# Patient Record
Sex: Female | Born: 1960 | Race: White | Hispanic: No | Marital: Single | Smoking: Never smoker
Health system: Southern US, Community
[De-identification: ages and names within clinical notes are randomized; demographics above are authoritative.]

## PROBLEM LIST (undated history)

## (undated) DIAGNOSIS — Z8669 Personal history of other diseases of the nervous system and sense organs: Secondary | ICD-10-CM

## (undated) DIAGNOSIS — B977 Papillomavirus as the cause of diseases classified elsewhere: Secondary | ICD-10-CM

## (undated) DIAGNOSIS — Z8709 Personal history of other diseases of the respiratory system: Secondary | ICD-10-CM

## (undated) DIAGNOSIS — Z87898 Personal history of other specified conditions: Secondary | ICD-10-CM

## (undated) DIAGNOSIS — F32A Depression, unspecified: Secondary | ICD-10-CM

## (undated) DIAGNOSIS — N83209 Unspecified ovarian cyst, unspecified side: Secondary | ICD-10-CM

## (undated) DIAGNOSIS — M25519 Pain in unspecified shoulder: Secondary | ICD-10-CM

## (undated) DIAGNOSIS — K635 Polyp of colon: Secondary | ICD-10-CM

## (undated) DIAGNOSIS — F329 Major depressive disorder, single episode, unspecified: Secondary | ICD-10-CM

## (undated) DIAGNOSIS — Z8619 Personal history of other infectious and parasitic diseases: Secondary | ICD-10-CM

## (undated) DIAGNOSIS — Z8742 Personal history of other diseases of the female genital tract: Secondary | ICD-10-CM

## (undated) DIAGNOSIS — Z8639 Personal history of other endocrine, nutritional and metabolic disease: Secondary | ICD-10-CM

## (undated) DIAGNOSIS — N924 Excessive bleeding in the premenopausal period: Secondary | ICD-10-CM

## (undated) DIAGNOSIS — N926 Irregular menstruation, unspecified: Secondary | ICD-10-CM

## (undated) DIAGNOSIS — E039 Hypothyroidism, unspecified: Secondary | ICD-10-CM

## (undated) HISTORY — PX: HEMORRHOID SURGERY: SHX153

## (undated) HISTORY — DX: Hypothyroidism, unspecified: E03.9

## (undated) HISTORY — DX: Depression, unspecified: F32.A

## (undated) HISTORY — DX: Personal history of other specified conditions: Z87.898

## (undated) HISTORY — DX: Papillomavirus as the cause of diseases classified elsewhere: B97.7

## (undated) HISTORY — DX: Major depressive disorder, single episode, unspecified: F32.9

## (undated) HISTORY — DX: Personal history of other diseases of the respiratory system: Z87.09

## (undated) HISTORY — DX: Polyp of colon: K63.5

## (undated) HISTORY — DX: Unspecified ovarian cyst, unspecified side: N83.209

## (undated) HISTORY — DX: Pain in unspecified shoulder: M25.519

## (undated) HISTORY — DX: Excessive bleeding in the premenopausal period: N92.4

## (undated) HISTORY — DX: Personal history of other diseases of the female genital tract: Z87.42

## (undated) HISTORY — DX: Irregular menstruation, unspecified: N92.6

## (undated) HISTORY — DX: Personal history of other endocrine, nutritional and metabolic disease: Z86.39

## (undated) HISTORY — DX: Personal history of other infectious and parasitic diseases: Z86.19

## (undated) HISTORY — DX: Personal history of other diseases of the nervous system and sense organs: Z86.69

---

## 2000-08-05 ENCOUNTER — Encounter: Payer: Self-pay | Admitting: *Deleted

## 2000-08-05 ENCOUNTER — Ambulatory Visit (HOSPITAL_COMMUNITY): Admission: RE | Admit: 2000-08-05 | Discharge: 2000-08-05 | Payer: Self-pay | Admitting: *Deleted

## 2001-12-22 ENCOUNTER — Other Ambulatory Visit: Admission: RE | Admit: 2001-12-22 | Discharge: 2001-12-22 | Payer: Self-pay | Admitting: *Deleted

## 2003-01-19 ENCOUNTER — Encounter: Payer: Self-pay | Admitting: Emergency Medicine

## 2003-01-19 ENCOUNTER — Emergency Department (HOSPITAL_COMMUNITY): Admission: EM | Admit: 2003-01-19 | Discharge: 2003-01-20 | Payer: Self-pay | Admitting: Emergency Medicine

## 2003-09-07 ENCOUNTER — Other Ambulatory Visit: Admission: RE | Admit: 2003-09-07 | Discharge: 2003-09-07 | Payer: Self-pay | Admitting: Obstetrics and Gynecology

## 2003-09-07 DIAGNOSIS — Z87898 Personal history of other specified conditions: Secondary | ICD-10-CM

## 2003-09-07 DIAGNOSIS — N926 Irregular menstruation, unspecified: Secondary | ICD-10-CM

## 2003-09-07 DIAGNOSIS — Z872 Personal history of diseases of the skin and subcutaneous tissue: Secondary | ICD-10-CM

## 2003-09-07 HISTORY — DX: Personal history of other specified conditions: Z87.898

## 2003-09-07 HISTORY — DX: Irregular menstruation, unspecified: N92.6

## 2003-09-07 HISTORY — DX: Personal history of diseases of the skin and subcutaneous tissue: Z87.2

## 2003-09-15 ENCOUNTER — Ambulatory Visit (HOSPITAL_COMMUNITY): Admission: RE | Admit: 2003-09-15 | Discharge: 2003-09-15 | Payer: Self-pay | Admitting: Obstetrics and Gynecology

## 2004-08-12 ENCOUNTER — Other Ambulatory Visit: Admission: RE | Admit: 2004-08-12 | Discharge: 2004-08-12 | Payer: Self-pay | Admitting: Family Medicine

## 2004-12-31 ENCOUNTER — Other Ambulatory Visit: Admission: RE | Admit: 2004-12-31 | Discharge: 2004-12-31 | Payer: Self-pay | Admitting: Obstetrics and Gynecology

## 2005-01-02 ENCOUNTER — Ambulatory Visit (HOSPITAL_COMMUNITY): Admission: RE | Admit: 2005-01-02 | Discharge: 2005-01-02 | Payer: Self-pay | Admitting: Obstetrics and Gynecology

## 2006-01-20 ENCOUNTER — Ambulatory Visit (HOSPITAL_COMMUNITY): Admission: RE | Admit: 2006-01-20 | Discharge: 2006-01-20 | Payer: Self-pay | Admitting: Obstetrics and Gynecology

## 2006-01-20 ENCOUNTER — Other Ambulatory Visit: Admission: RE | Admit: 2006-01-20 | Discharge: 2006-01-20 | Payer: Self-pay | Admitting: Obstetrics and Gynecology

## 2007-01-18 DIAGNOSIS — Z87898 Personal history of other specified conditions: Secondary | ICD-10-CM

## 2007-01-18 DIAGNOSIS — Z8742 Personal history of other diseases of the female genital tract: Secondary | ICD-10-CM

## 2007-01-18 HISTORY — DX: Personal history of other diseases of the female genital tract: Z87.42

## 2007-01-18 HISTORY — DX: Personal history of other specified conditions: Z87.898

## 2007-01-22 ENCOUNTER — Ambulatory Visit (HOSPITAL_COMMUNITY): Admission: RE | Admit: 2007-01-22 | Discharge: 2007-01-22 | Payer: Self-pay | Admitting: Obstetrics and Gynecology

## 2007-02-08 DIAGNOSIS — Z87898 Personal history of other specified conditions: Secondary | ICD-10-CM

## 2007-02-08 HISTORY — DX: Personal history of other specified conditions: Z87.898

## 2009-01-10 ENCOUNTER — Ambulatory Visit (HOSPITAL_COMMUNITY): Admission: RE | Admit: 2009-01-10 | Discharge: 2009-01-10 | Payer: Self-pay | Admitting: Obstetrics and Gynecology

## 2009-10-10 ENCOUNTER — Encounter: Admission: RE | Admit: 2009-10-10 | Discharge: 2009-10-10 | Payer: Self-pay | Admitting: Endocrinology

## 2010-01-11 ENCOUNTER — Ambulatory Visit (HOSPITAL_COMMUNITY): Admission: RE | Admit: 2010-01-11 | Discharge: 2010-01-11 | Payer: Self-pay | Admitting: Obstetrics and Gynecology

## 2010-01-28 DIAGNOSIS — N924 Excessive bleeding in the premenopausal period: Secondary | ICD-10-CM

## 2010-01-28 HISTORY — DX: Excessive bleeding in the premenopausal period: N92.4

## 2010-04-30 DIAGNOSIS — N83209 Unspecified ovarian cyst, unspecified side: Secondary | ICD-10-CM

## 2010-04-30 HISTORY — DX: Unspecified ovarian cyst, unspecified side: N83.209

## 2010-10-11 ENCOUNTER — Encounter
Admission: RE | Admit: 2010-10-11 | Discharge: 2010-10-11 | Payer: Self-pay | Source: Home / Self Care | Attending: Endocrinology | Admitting: Endocrinology

## 2010-11-08 ENCOUNTER — Other Ambulatory Visit: Payer: Self-pay | Admitting: Endocrinology

## 2010-11-08 ENCOUNTER — Other Ambulatory Visit (HOSPITAL_COMMUNITY)
Admission: RE | Admit: 2010-11-08 | Discharge: 2010-11-08 | Disposition: A | Discharge status: Home / Self Care | Payer: Commercial Indemnity | Source: Ambulatory Visit | Attending: Endocrinology | Admitting: Endocrinology

## 2010-11-08 DIAGNOSIS — E049 Nontoxic goiter, unspecified: Secondary | ICD-10-CM | POA: Insufficient documentation

## 2010-11-12 ENCOUNTER — Other Ambulatory Visit: Payer: Self-pay | Admitting: Endocrinology

## 2010-11-12 DIAGNOSIS — E041 Nontoxic single thyroid nodule: Secondary | ICD-10-CM

## 2010-11-20 ENCOUNTER — Other Ambulatory Visit: Payer: Self-pay | Admitting: Endocrinology

## 2010-11-20 DIAGNOSIS — E041 Nontoxic single thyroid nodule: Secondary | ICD-10-CM

## 2010-11-26 ENCOUNTER — Other Ambulatory Visit: Payer: Self-pay

## 2011-01-09 ENCOUNTER — Other Ambulatory Visit (HOSPITAL_COMMUNITY): Payer: Self-pay | Admitting: Obstetrics and Gynecology

## 2011-01-09 DIAGNOSIS — Z1231 Encounter for screening mammogram for malignant neoplasm of breast: Secondary | ICD-10-CM

## 2011-01-24 ENCOUNTER — Ambulatory Visit (HOSPITAL_COMMUNITY)
Admission: RE | Admit: 2011-01-24 | Discharge: 2011-01-24 | Disposition: A | Discharge status: Home / Self Care | Payer: Commercial Indemnity | Source: Ambulatory Visit | Attending: Obstetrics and Gynecology | Admitting: Obstetrics and Gynecology

## 2011-01-24 DIAGNOSIS — Z1231 Encounter for screening mammogram for malignant neoplasm of breast: Secondary | ICD-10-CM | POA: Insufficient documentation

## 2011-04-14 ENCOUNTER — Other Ambulatory Visit: Payer: Commercial Indemnity

## 2011-04-23 ENCOUNTER — Ambulatory Visit
Admission: RE | Admit: 2011-04-23 | Discharge: 2011-04-23 | Disposition: A | Discharge status: Home / Self Care | Payer: Commercial Indemnity | Source: Ambulatory Visit | Attending: Endocrinology | Admitting: Endocrinology

## 2011-04-23 DIAGNOSIS — E041 Nontoxic single thyroid nodule: Secondary | ICD-10-CM

## 2012-02-24 ENCOUNTER — Encounter: Payer: Self-pay | Admitting: Obstetrics and Gynecology

## 2012-02-24 ENCOUNTER — Ambulatory Visit (INDEPENDENT_AMBULATORY_CARE_PROVIDER_SITE_OTHER): Payer: BC Managed Care – PPO | Admitting: Obstetrics and Gynecology

## 2012-02-24 VITALS — BP 108/68 | Ht 65.25 in | Wt 152.0 lb

## 2012-02-24 DIAGNOSIS — Z124 Encounter for screening for malignant neoplasm of cervix: Secondary | ICD-10-CM

## 2012-02-24 DIAGNOSIS — Z862 Personal history of diseases of the blood and blood-forming organs and certain disorders involving the immune mechanism: Secondary | ICD-10-CM

## 2012-02-24 DIAGNOSIS — E039 Hypothyroidism, unspecified: Secondary | ICD-10-CM

## 2012-02-24 DIAGNOSIS — Z8639 Personal history of other endocrine, nutritional and metabolic disease: Secondary | ICD-10-CM

## 2012-02-24 DIAGNOSIS — D259 Leiomyoma of uterus, unspecified: Secondary | ICD-10-CM

## 2012-02-24 DIAGNOSIS — D219 Benign neoplasm of connective and other soft tissue, unspecified: Secondary | ICD-10-CM

## 2012-02-24 MED ORDER — CLOTRIMAZOLE-BETAMETHASONE 1-0.05 % EX LOTN: TOPICAL_LOTION | Freq: Two times a day (BID) | CUTANEOUS | Status: AC

## 2012-02-24 NOTE — Progress Notes (Signed)
Last Pap:01/24/2009 WNL: Yes Regular Periods:no Contraception: none  Monthly Breast exam:yes occ Tetanus<75yrs:no ? Nl.Bladder Function:yes Daily BMs:yes Healthy Diet:yes Calcium:yes Mammogram:yes 01/24/2011 Exercise:yes Seatbelt: yes Abuse at home: no Stressful work:no Sigmoid-colonoscopy: 04/19/2010 Dr.Mann Bone Density: Yes 2012-wnl per pt Dr.Kohut BMI=25  Entered by cw,cma Subjective:   Subjective:    Marie Avery is a 51 y.o. female G0P0000 who presents for annual exam.  She has a several month history of rash under breasts which itches.  Has had it before, treated with a shampoo  The following portions of the patient's history were reviewed and updated as appropriate: allergies, current medications, past family history, past medical history, past social history, past surgical history and problem list.  Review of Systems Pertinent items are noted in HPI. Gastrointestinal:No change in bowel habits, no abdominal pain, no rectal bleeding Genitourinary:negative for dysuria, frequency, hematuria, nocturia and urinary incontinence    Objective:     BP 108/68  Ht 5' 5.25" (1.657 m)  Wt 152 lb (68.947 kg)  BMI 25.10 kg/m2  Weight:  Wt Readings from Last 1 Encounters:  02/24/12 152 lb (68.947 kg)     BMI: Body mass index is 25.10 kg/(m^2). General Appearance: Alert, appropriate appearance for age. No acute distress HEENT: Grossly normal Neck / Thyroid: Supple, no masses, nodes or enlargement Lungs: clear to auscultation bilaterally Back: No CVA tenderness Breast Exam: No masses or nodes.No dimpling, nipple retraction or discharge.Submammry eruption with circular scaling lesions Cardiovascular: Regular rate and rhythm. S1, S2, no murmur Gastrointestinal: Soft, non-tender, no masses or organomegaly Pelvic Exam: Vulva and vagina appear normal. Bimanual exam reveals normal uterus and adnexa. Rectovaginal: normal rectal, no masses Lymphatic Exam: Non-palpable nodes in  neck, clavicular, axillary, or inguinal regions Skin: no rash or abnormalities Neurologic: Normal gait and speech, no tremor  Psychiatric: Alert and oriented, appropriate affect.    Urinalysis:Not done      Assessment:    Asymptomatic fibroids  Tinea versicolor   Plan:    Pap with HR HPV Lotrisone lotion MG  Follow-up:  for annual exam       Marie Avery PMD

## 2012-02-24 NOTE — Patient Instructions (Signed)
Tinea Versicolor Tinea versicolor is a common yeast infection of the skin. This condition becomes known when the yeast on our skin starts to overgrow (yeast is a normal inhabitant on our skin). This condition is noticed as white or light brown patches on brown skin, and is more evident in the summer on tanned skin. These areas are slightly scaly if scratched. The light patches from the yeast become evident when the yeast creates "holes in your suntan". This is most often noticed in the summer. The patches are usually located on the chest, back, pubis, neck and body folds. However, it may occur on any area of body. Mild itching and inflammation (redness or soreness) may be present. DIAGNOSIS   The diagnosisof this is made clinically (by looking). Cultures from samples are usually not needed. Examination under the microscope may help. However, yeast is normally found on skin. The diagnosis still remains clinical. Examination under Wood's Ultraviolet Light can determine the extent of the infection. TREATMENT   This common infection is usually only of cosmetic (only a concern to your appearance). It is easily treated with dandruff shampoo used during showers or bathing. Vigorous scrubbing will eliminate the yeast over several days time. The light areas in your skin may remain for weeks or months after the infection is cured unless your skin is exposed to sunlight. The lighter or darker spots caused by the fungus that remain after complete treatment are not a sign of treatment failure; it will take a long time to resolve. Your caregiver may recommend a number of commercial preparations or medication by mouth if home care is not working. Recurrence is common and preventative medication may be necessary. This skin condition is not highly contagious. Special care is not needed to protect close friends and family members. Normal hygiene is usually enough. Follow up is required only if you develop complications (such as  a secondary infection from scratching), if recommended by your caregiver, or if no relief is obtained from the preparations used. Document Released: 09/19/2000 Document Revised: 09/11/2011 Document Reviewed: 11/01/2008 Exit[employer] Patient Information 2012 [employer]. 

## 2012-02-27 LAB — PAP IG AND HPV HIGH-RISK: HPV DNA High Risk: NOT DETECTED

## 2014-02-11 ENCOUNTER — Encounter (HOSPITAL_COMMUNITY): Payer: Self-pay | Admitting: Emergency Medicine

## 2014-02-11 ENCOUNTER — Emergency Department (HOSPITAL_COMMUNITY)
Admission: EM | Admit: 2014-02-11 | Discharge: 2014-02-11 | Disposition: A | Discharge status: Home / Self Care | Payer: BC Managed Care – PPO | Attending: Emergency Medicine | Admitting: Emergency Medicine

## 2014-02-11 ENCOUNTER — Emergency Department (HOSPITAL_COMMUNITY): Payer: BC Managed Care – PPO

## 2014-02-11 DIAGNOSIS — T23129A Burn of first degree of unspecified single finger (nail) except thumb, initial encounter: Secondary | ICD-10-CM | POA: Insufficient documentation

## 2014-02-11 DIAGNOSIS — Z8619 Personal history of other infectious and parasitic diseases: Secondary | ICD-10-CM | POA: Insufficient documentation

## 2014-02-11 DIAGNOSIS — Y92410 Unspecified street and highway as the place of occurrence of the external cause: Secondary | ICD-10-CM | POA: Insufficient documentation

## 2014-02-11 DIAGNOSIS — T23109A Burn of first degree of unspecified hand, unspecified site, initial encounter: Secondary | ICD-10-CM | POA: Diagnosis present

## 2014-02-11 DIAGNOSIS — Z23 Encounter for immunization: Secondary | ICD-10-CM | POA: Insufficient documentation

## 2014-02-11 DIAGNOSIS — Z79899 Other long term (current) drug therapy: Secondary | ICD-10-CM | POA: Insufficient documentation

## 2014-02-11 DIAGNOSIS — Z860100 Personal history of colon polyps, unspecified: Secondary | ICD-10-CM | POA: Insufficient documentation

## 2014-02-11 DIAGNOSIS — E039 Hypothyroidism, unspecified: Secondary | ICD-10-CM | POA: Insufficient documentation

## 2014-02-11 DIAGNOSIS — Z8709 Personal history of other diseases of the respiratory system: Secondary | ICD-10-CM | POA: Insufficient documentation

## 2014-02-11 DIAGNOSIS — Z8679 Personal history of other diseases of the circulatory system: Secondary | ICD-10-CM | POA: Insufficient documentation

## 2014-02-11 DIAGNOSIS — Z8639 Personal history of other endocrine, nutritional and metabolic disease: Secondary | ICD-10-CM | POA: Insufficient documentation

## 2014-02-11 DIAGNOSIS — Z791 Long term (current) use of non-steroidal anti-inflammatories (NSAID): Secondary | ICD-10-CM | POA: Insufficient documentation

## 2014-02-11 DIAGNOSIS — T148XXA Other injury of unspecified body region, initial encounter: Secondary | ICD-10-CM | POA: Diagnosis present

## 2014-02-11 DIAGNOSIS — R0602 Shortness of breath: Secondary | ICD-10-CM | POA: Insufficient documentation

## 2014-02-11 DIAGNOSIS — S3981XA Other specified injuries of abdomen, initial encounter: Secondary | ICD-10-CM | POA: Insufficient documentation

## 2014-02-11 DIAGNOSIS — IMO0002 Reserved for concepts with insufficient information to code with codable children: Secondary | ICD-10-CM | POA: Insufficient documentation

## 2014-02-11 DIAGNOSIS — Z8659 Personal history of other mental and behavioral disorders: Secondary | ICD-10-CM | POA: Insufficient documentation

## 2014-02-11 DIAGNOSIS — Z8742 Personal history of other diseases of the female genital tract: Secondary | ICD-10-CM | POA: Insufficient documentation

## 2014-02-11 DIAGNOSIS — Y9389 Activity, other specified: Secondary | ICD-10-CM | POA: Insufficient documentation

## 2014-02-11 DIAGNOSIS — Z880 Allergy status to penicillin: Secondary | ICD-10-CM | POA: Insufficient documentation

## 2014-02-11 MED ORDER — ACETAMINOPHEN 325 MG PO TABS
650.0000 mg | ORAL_TABLET | Freq: Once | ORAL | Status: AC
  Administered 2014-02-11: 650 mg via ORAL
  Filled 2014-02-11: qty 2

## 2014-02-11 MED ORDER — TETANUS-DIPHTH-ACELL PERTUSSIS 5-2.5-18.5 LF-MCG/0.5 IM SUSP
0.5000 mL | Freq: Once | INTRAMUSCULAR | Status: AC
  Administered 2014-02-11: 0.5 mL via INTRAMUSCULAR
  Filled 2014-02-11: qty 0.5
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
## 2014-02-11 NOTE — Discharge Instructions (Signed)

## 2014-02-11 NOTE — ED Notes (Signed)
MD Harrison at bedside. 

## 2014-08-16 ENCOUNTER — Other Ambulatory Visit (HOSPITAL_COMMUNITY): Payer: Self-pay | Admitting: Obstetrics and Gynecology

## 2014-08-16 DIAGNOSIS — Z1231 Encounter for screening mammogram for malignant neoplasm of breast: Secondary | ICD-10-CM

## 2014-09-07 ENCOUNTER — Ambulatory Visit (HOSPITAL_COMMUNITY)
Admission: RE | Admit: 2014-09-07 | Discharge: 2014-09-07 | Disposition: A | Discharge status: Home / Self Care | Payer: BC Managed Care – PPO | Source: Ambulatory Visit | Attending: Obstetrics and Gynecology | Admitting: Obstetrics and Gynecology

## 2014-09-07 DIAGNOSIS — Z1231 Encounter for screening mammogram for malignant neoplasm of breast: Secondary | ICD-10-CM

## 2014-12-25 ENCOUNTER — Other Ambulatory Visit: Payer: Self-pay | Admitting: Endocrinology

## 2014-12-25 DIAGNOSIS — E041 Nontoxic single thyroid nodule: Secondary | ICD-10-CM

## 2015-01-01 ENCOUNTER — Ambulatory Visit
Admission: RE | Admit: 2015-01-01 | Discharge: 2015-01-01 | Disposition: A | Discharge status: Home / Self Care | Payer: BLUE CROSS/BLUE SHIELD | Source: Ambulatory Visit | Attending: Endocrinology | Admitting: Endocrinology

## 2015-01-01 DIAGNOSIS — E041 Nontoxic single thyroid nodule: Secondary | ICD-10-CM

## 2016-07-31 ENCOUNTER — Other Ambulatory Visit: Payer: Self-pay | Admitting: Endocrinology

## 2016-07-31 DIAGNOSIS — E041 Nontoxic single thyroid nodule: Secondary | ICD-10-CM

## 2016-08-05 ENCOUNTER — Ambulatory Visit
Admission: RE | Admit: 2016-08-05 | Discharge: 2016-08-05 | Disposition: A | Discharge status: Home / Self Care | Payer: BLUE CROSS/BLUE SHIELD | Source: Ambulatory Visit | Attending: Endocrinology | Admitting: Endocrinology

## 2016-08-05 DIAGNOSIS — E041 Nontoxic single thyroid nodule: Secondary | ICD-10-CM

## 2017-03-20 IMAGING — US US SOFT TISSUE HEAD/NECK
1 series · 13 of 25 positions shown · non-contrast
Comparison: 04/23/2011, 10/11/2010, 10/10/2009, 10/08/2007

CLINICAL DATA: 53-year-old female with a history of thyroid
nodules.

EXAM:
THYROID ULTRASOUND
TECHNIQUE: Ultrasound examination of the thyroid gland and adjacent soft
tissues was performed.

[Series 1: us soft tissue head/neck · 0.08mm/px · 13 of 72 slices shown]
[im 1/72]
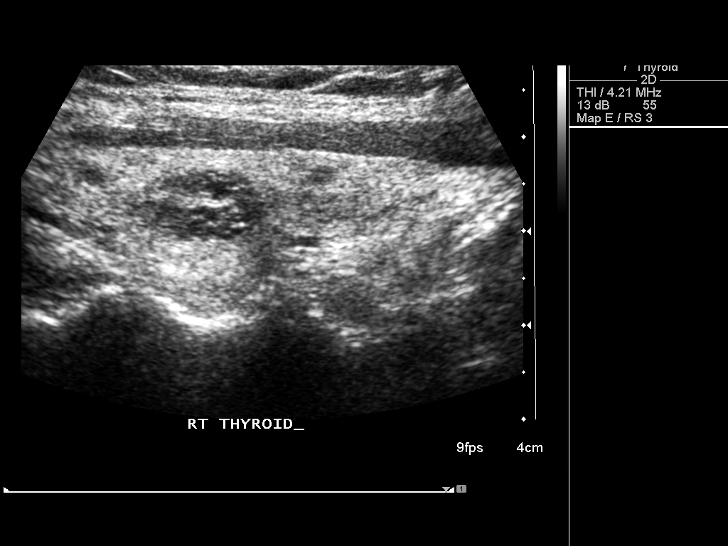
[im 6/72]
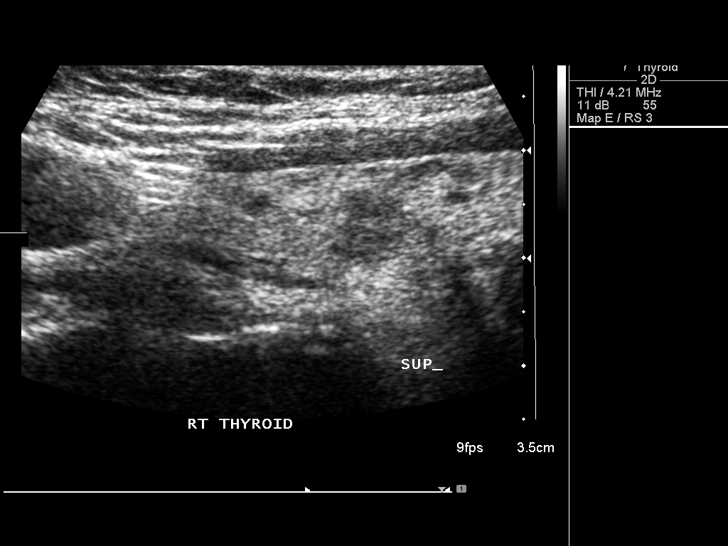
[im 12/72]
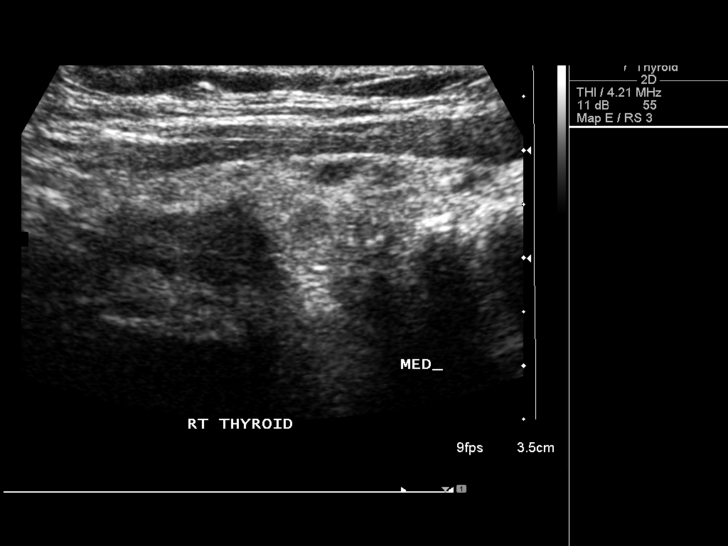
[im 18/72]
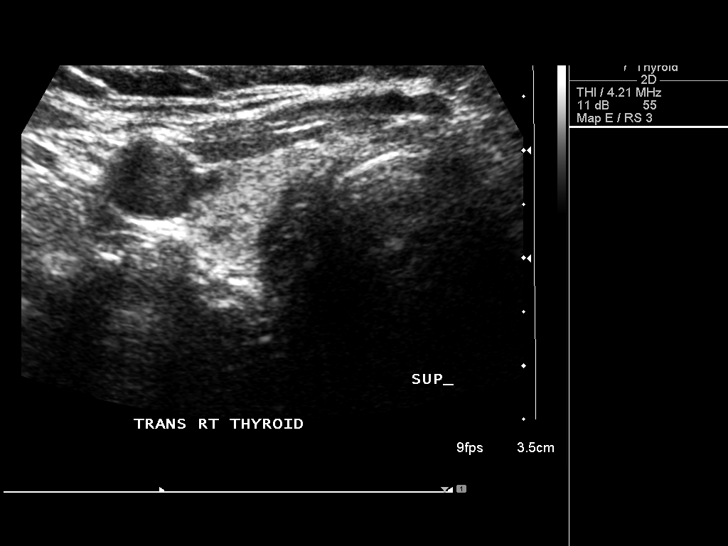
[im 24/72]
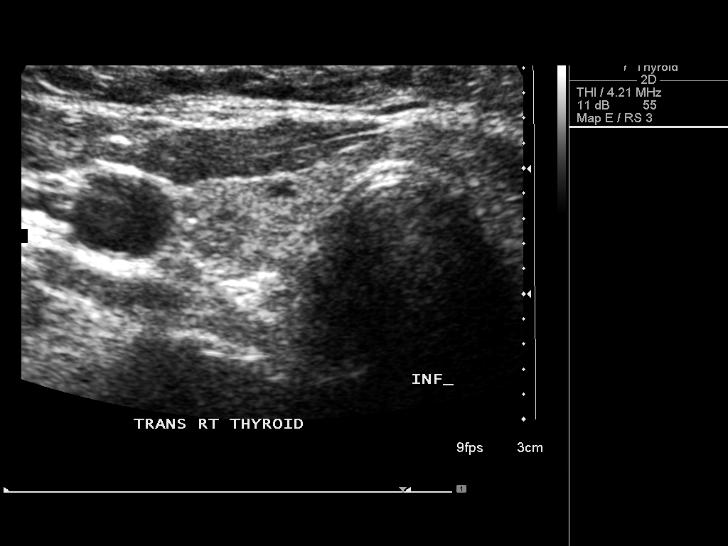
[im 30/72]
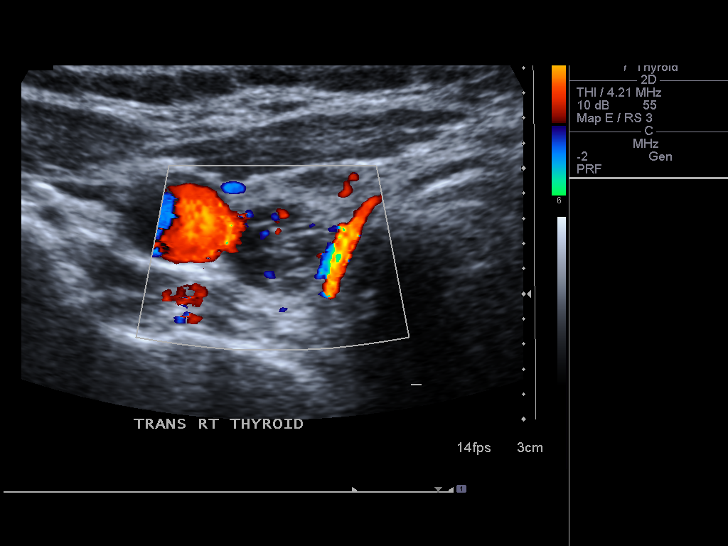
[im 36/72]
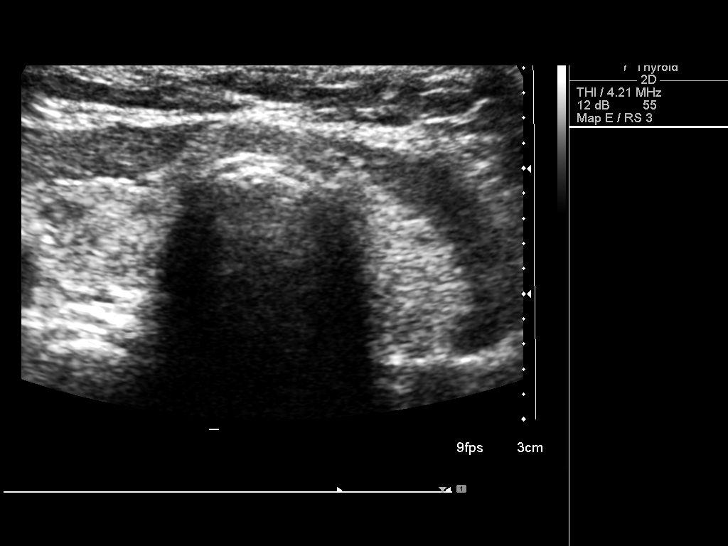
[im 42/72]
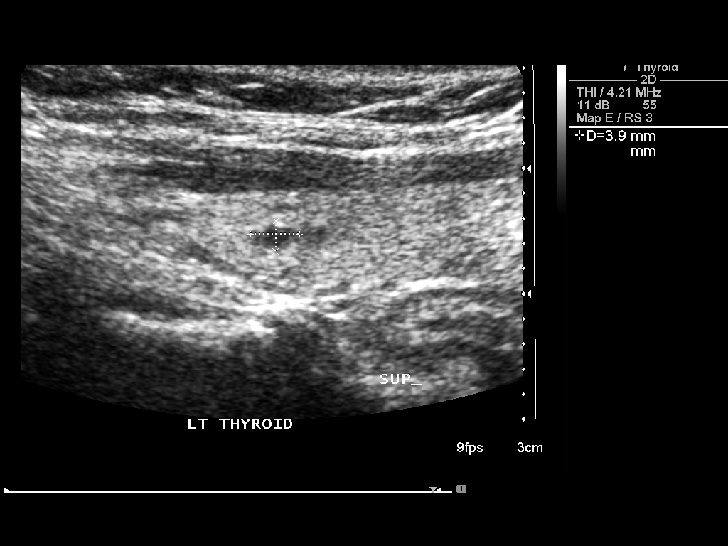
[im 48/72]
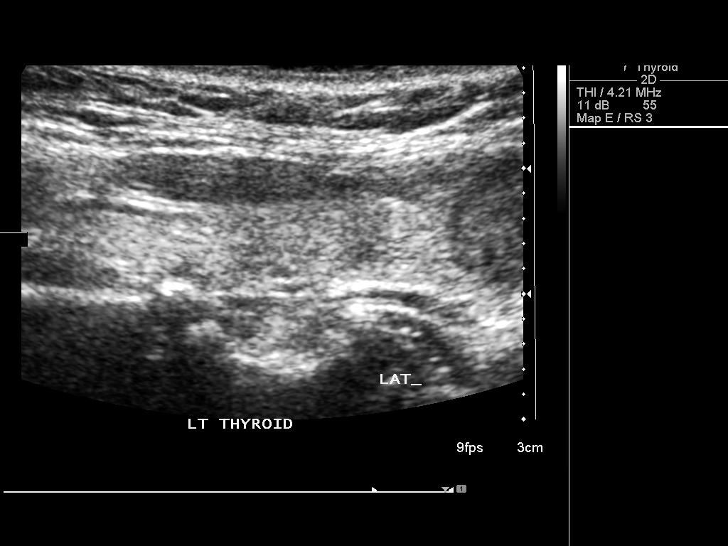
[im 54/72]
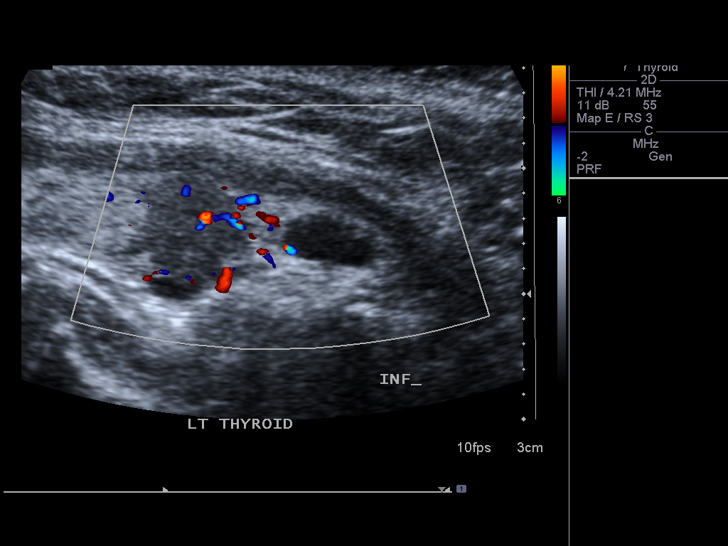
[im 60/72]
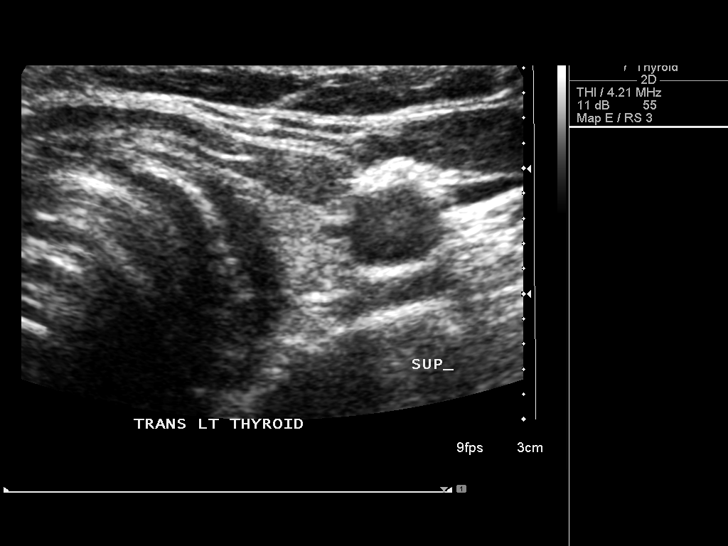
[im 66/72]
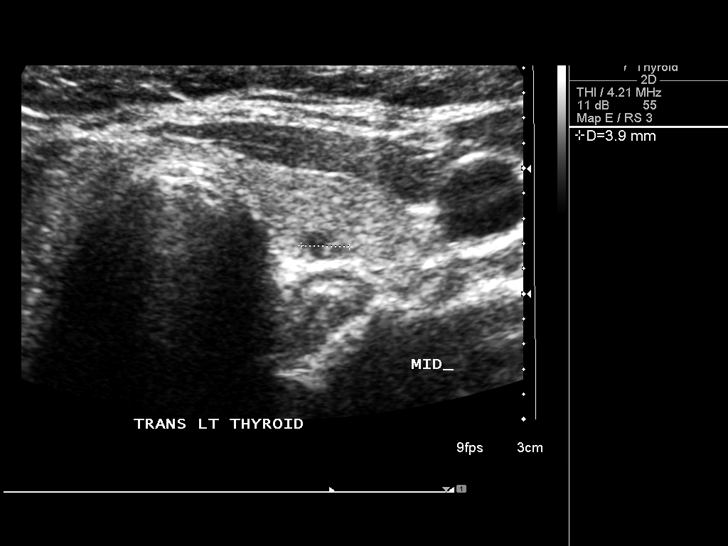
[im 72/72]
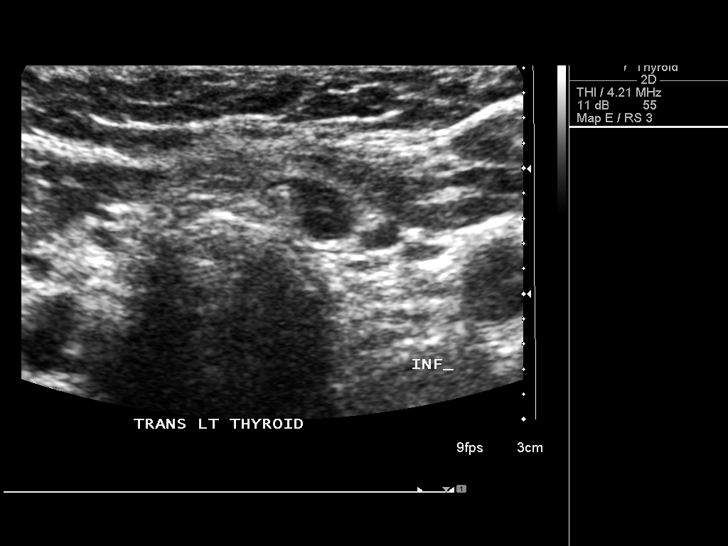

[13 of 25 positions shown; findings below may reference images not displayed]

FINDINGS: Right thyroid lobe

Measurements: 4.4 cm x 1.5 cm x 1.2 cm. Relatively heterogeneous
appearance of right thyroid tissue. Multiple right-sided thyroid
nodules are again identified. The majority are less than 1 cm.
Largest measures 1.4 cm x 8 mm x 10 mm. No internal calcifications.

Left thyroid lobe

Measurements: 4.4 cm x 0.9 cm x 1.3 cm. Heterogeneous appearance of
the left thyroid tissue. Multiple left-sided nodules are again
identified, all of which are 1 cm or less. The 1 cm lesion
demonstrates no internal calcifications.

Isthmus

Thickness: 3 mm.  No nodules visualized.

Lymphadenopathy

None visualized.
IMPRESSION: Multinodular thyroid, with none of the lesions meeting criteria for
biopsy.

Follow-up by clinical exam is recommended. If patient has known risk
factors for thyroid carcinoma, consider follow-up ultrasound in 12
months. If patient is clinically hyperthyroid, consider nuclear
medicine thyroid uptake and scan.Reference: Management of Thyroid
Nodules Detected at US: Society of Radiologists in Ultrasound

## 2018-01-01 ENCOUNTER — Other Ambulatory Visit: Payer: Self-pay | Admitting: Internal Medicine

## 2018-01-01 DIAGNOSIS — E042 Nontoxic multinodular goiter: Secondary | ICD-10-CM

## 2018-01-11 ENCOUNTER — Ambulatory Visit
Admission: RE | Admit: 2018-01-11 | Discharge: 2018-01-11 | Disposition: A | Discharge status: Home / Self Care | Payer: BLUE CROSS/BLUE SHIELD | Source: Ambulatory Visit | Attending: Internal Medicine | Admitting: Internal Medicine

## 2018-01-11 DIAGNOSIS — E042 Nontoxic multinodular goiter: Secondary | ICD-10-CM

## 2018-10-23 IMAGING — US US THYROID
1 series · 13 of 25 positions shown · non-contrast
Comparison: 01/01/2015

CLINICAL DATA: Prior ultrasound follow-up.  Follow-up nodules

EXAM:
THYROID ULTRASOUND
TECHNIQUE: Ultrasound examination of the thyroid gland and adjacent soft
tissues was performed.

[Series 1: us thyroid · 0.06mm/px · 13 of 90 slices shown]
[im 1/90]
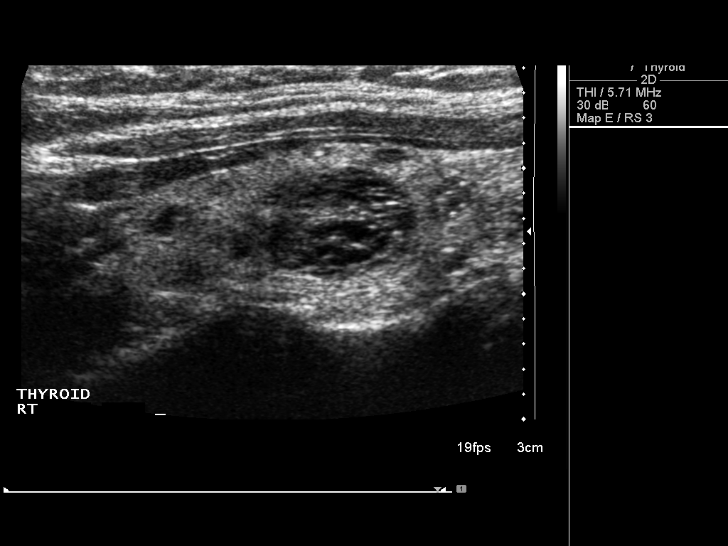
[im 8/90]
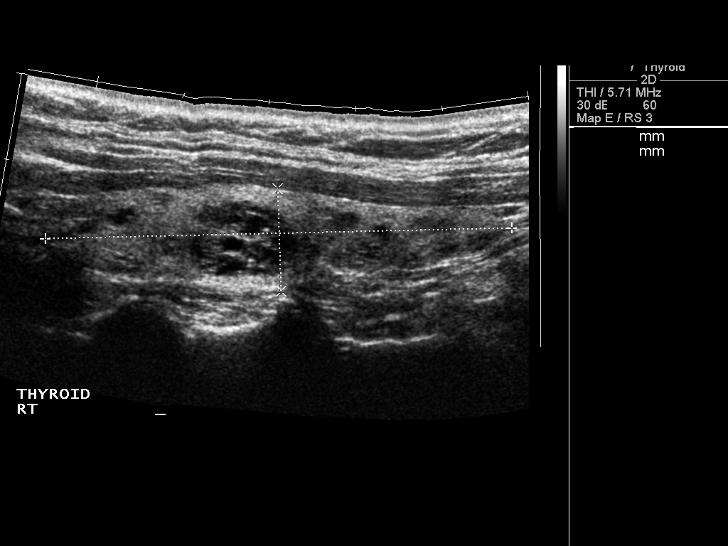
[im 15/90]
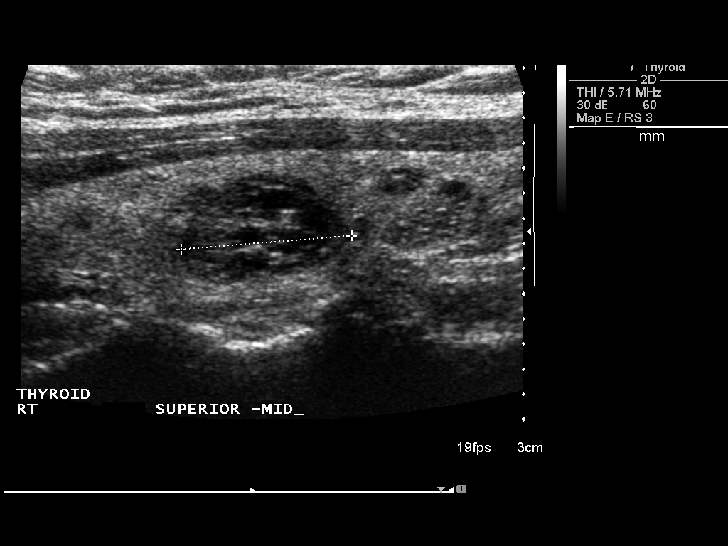
[im 23/90]
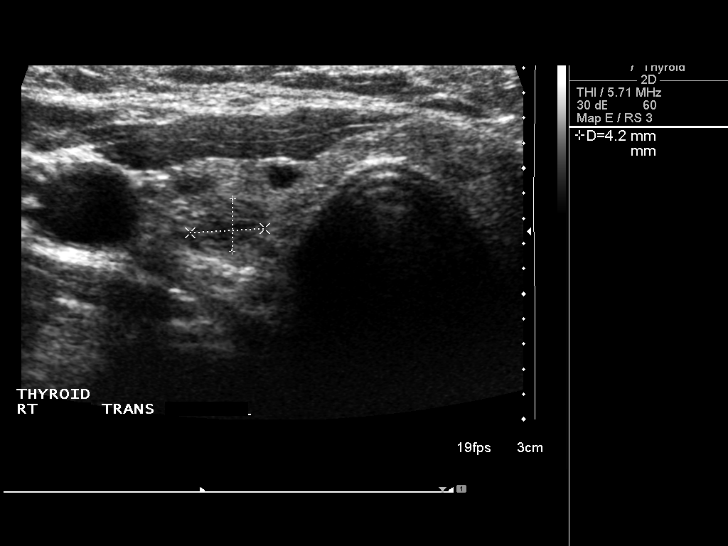
[im 30/90]
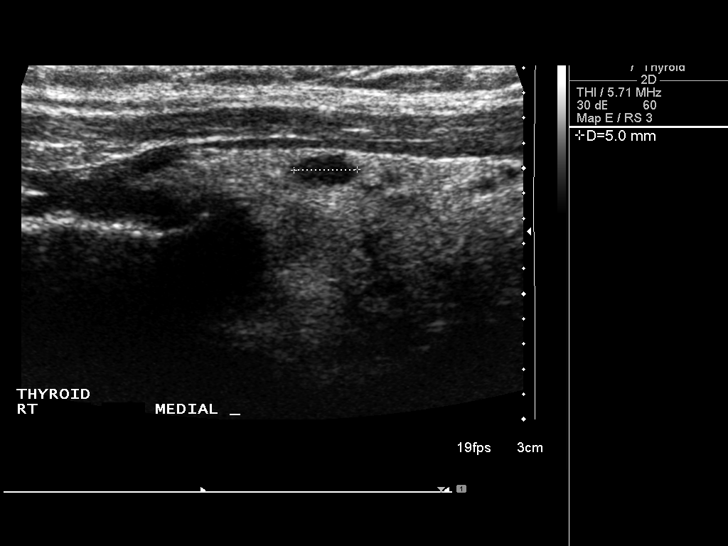
[im 38/90]
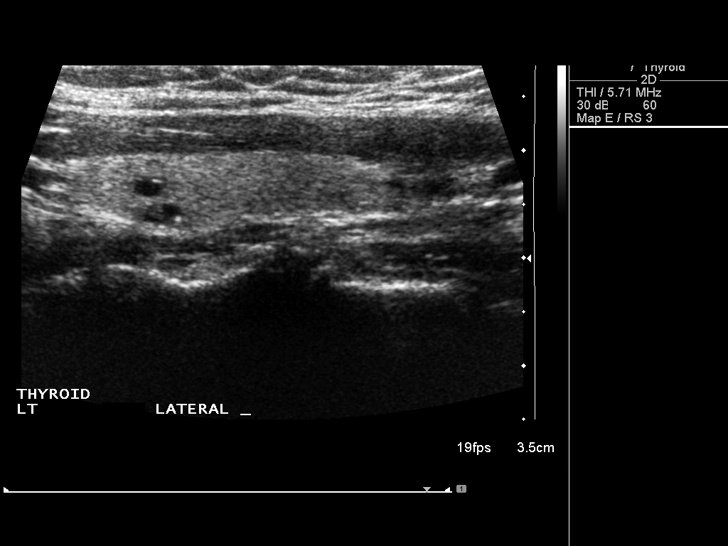
[im 45/90]
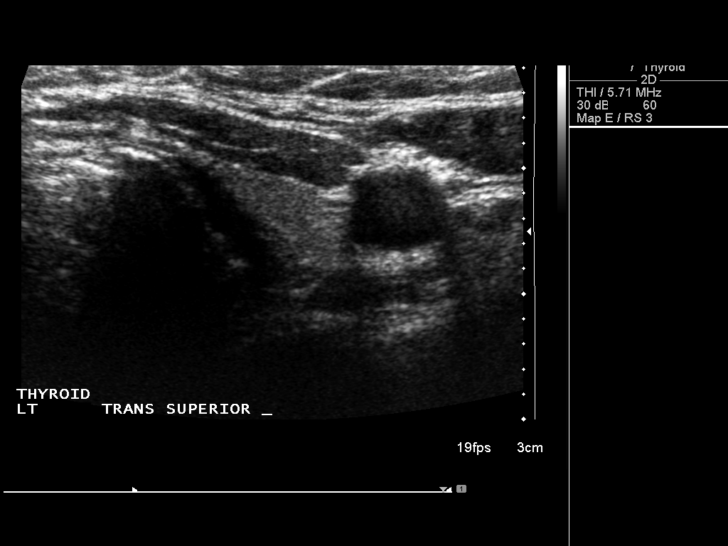
[im 52/90]
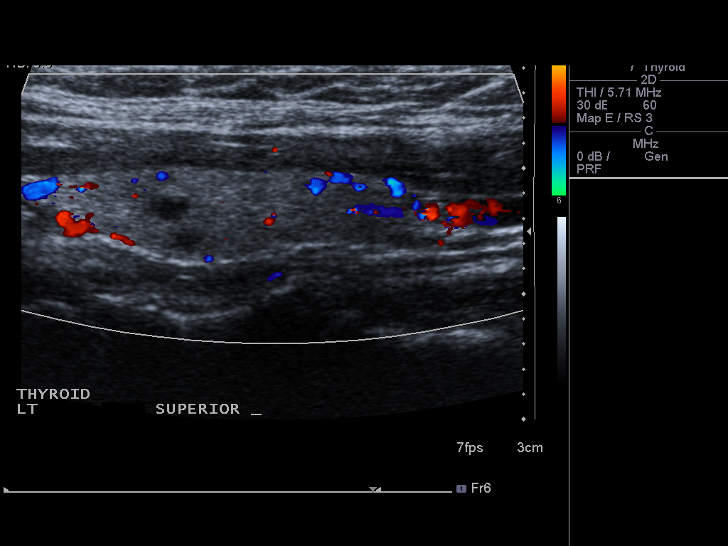
[im 60/90]
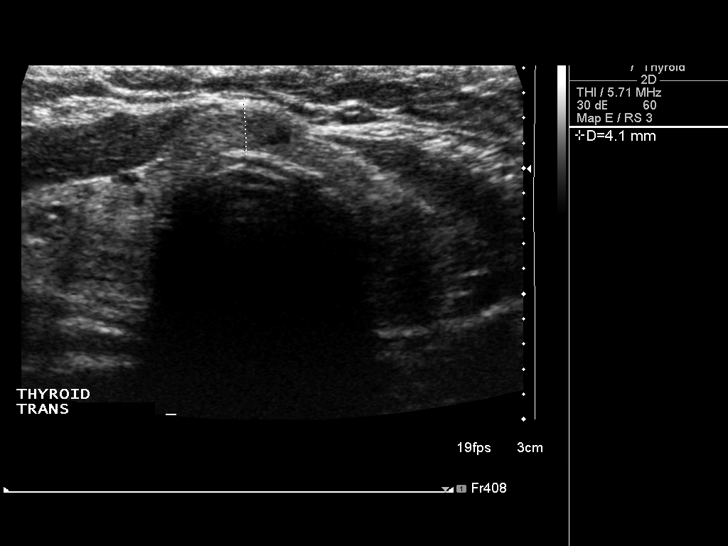
[im 67/90]
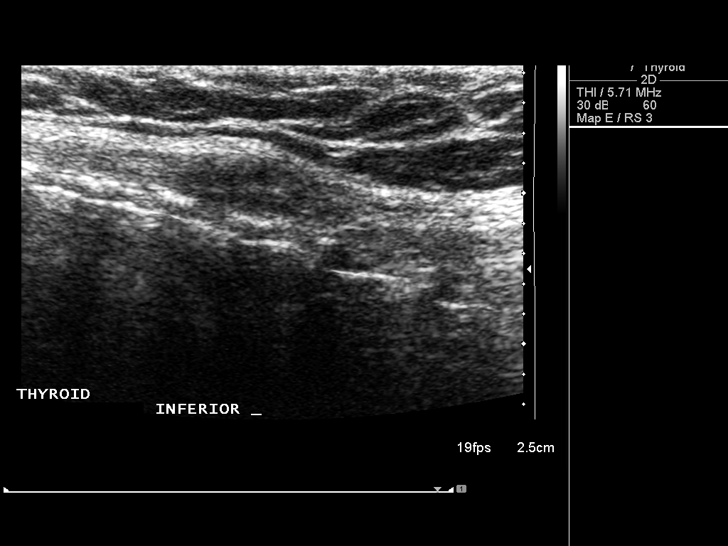
[im 75/90]
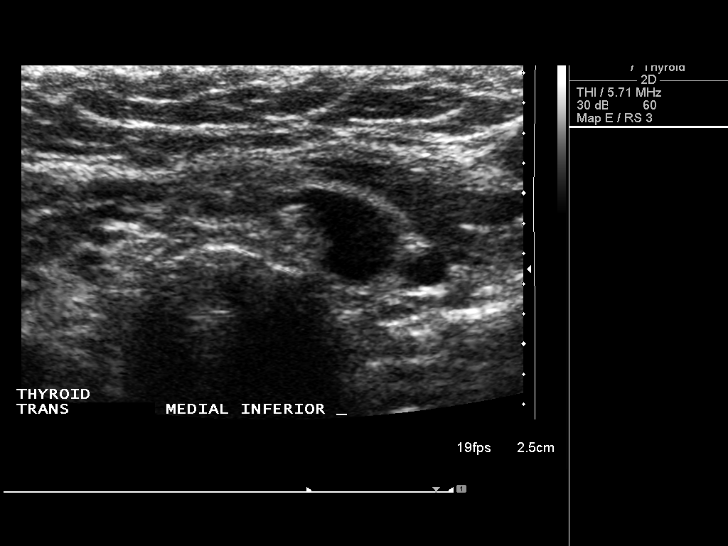
[im 82/90]
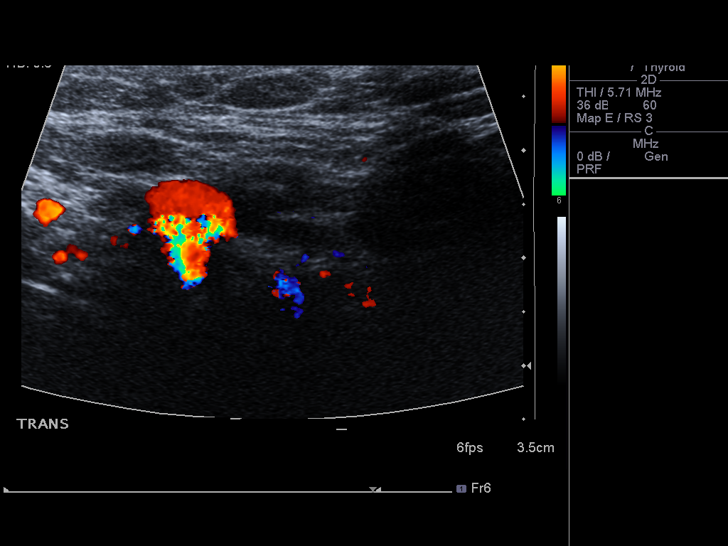
[im 90/90]
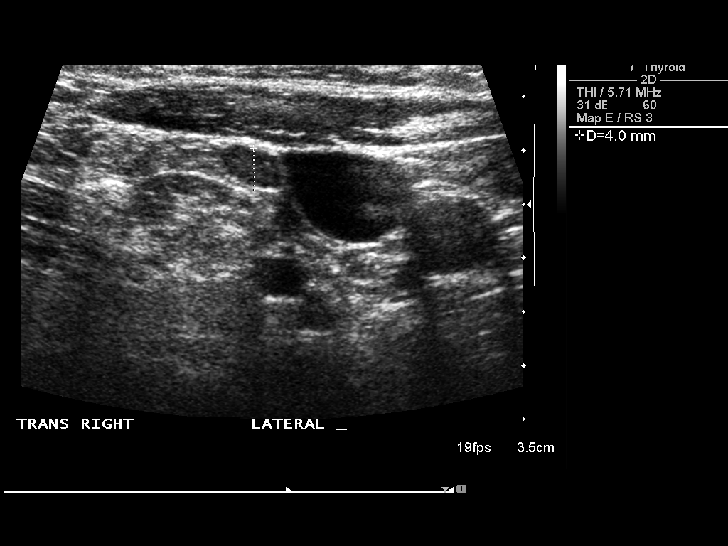

[13 of 25 positions shown; findings below may reference images not displayed]

FINDINGS: Parenchymal Echotexture: Moderately heterogenous

Estimated total number of nodules >/= 1 cm: 4

Number of spongiform nodules >/=  2 cm not described below (TR1): 0

Number of mixed cystic and solid nodules >/= 1.5 cm not described
below (TR2): 0

_________________________________________________________

Isthmus: 0.4 cm, previously 0.3 cm

Right isthmic nodule measures 1.0 x 0.3 x 1.1 cm. In retrospect, it
was probably present and is stable in size.

Left isthmic nodules measures 1.0 x 0.5 x 0.6 cm. It is new. It
measures 1.0 cm, is solid, and is hypoechoic. It is a TR4 lesion
requiring follow-up.

0.9 cm cyst has a benign appearance.

_________________________________________________________

Right lobe: 5.4 x 1.2 x 1.5 cm, previously 4.4 x 1.5 x 1.2 cm

1.4 x 0.9 x 1.0 cm upper pole nodule previously measured 1.4 x 0.8 x
1.0 cm

Other nodules are not significantly change measuring 0.5 cm or less
in size.

_________________________________________________________

Left lobe: 4.5 x 0.8 x 1.1 cm, previously 4.4 x 0.9 x 1.3 cm

1.0 x 1.0 x 0.9 cm lower pole hypoechoic nodule previously measured
1.0 x 1.0 x 0.9 cm.

Other smaller nodules are not significantly changed.
IMPRESSION: Most of the nodules throughout both lobes in the isthmus are not
significantly changed.

There is potentially a new nodule in the isthmus measuring 1.0 cm
requiring annual follow-up.

The above is in keeping with the ACR TI-RADS recommendations - [HOSPITAL] 1956;[DATE].

## 2018-10-31 IMAGING — US US THYROID
1 series · 12 of 25 positions shown · non-contrast
Comparison: 08/05/2016, 10/11/2010 and 10/08/2006

CLINICAL DATA: Multinodular goiter, nontoxic.  Follow-up nodules.

EXAM:
THYROID ULTRASOUND
TECHNIQUE: Ultrasound examination of the thyroid gland and adjacent soft
tissues was performed.

[Series 1: us thyroid · 0.04mm/px · 12 of 77 slices shown]
[im 4/77]
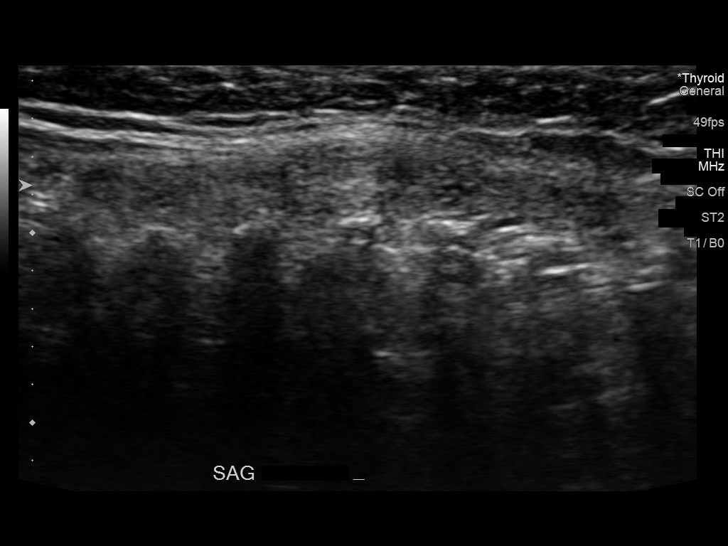
[im 10/77]
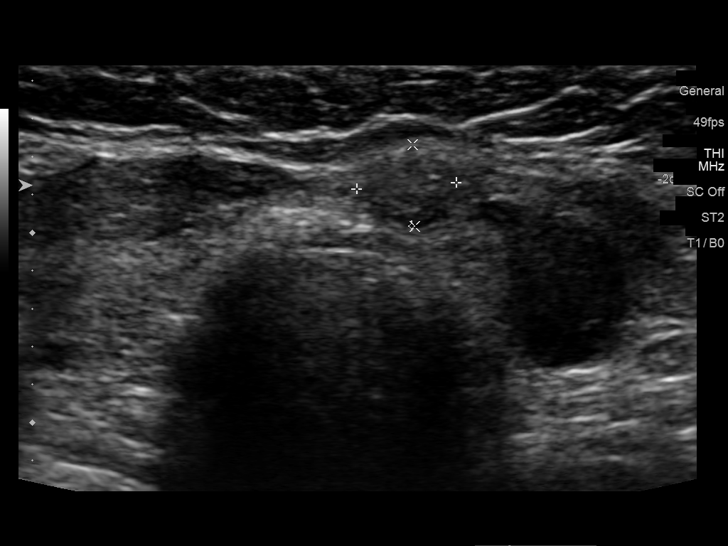
[im 16/77]
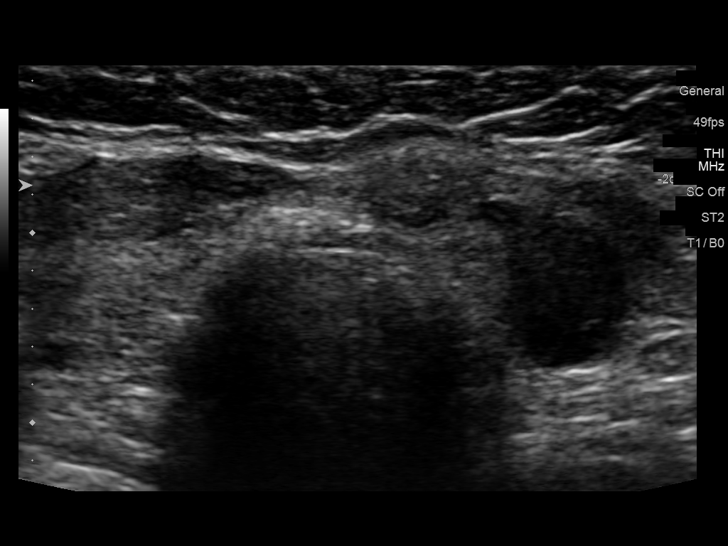
[im 23/77]
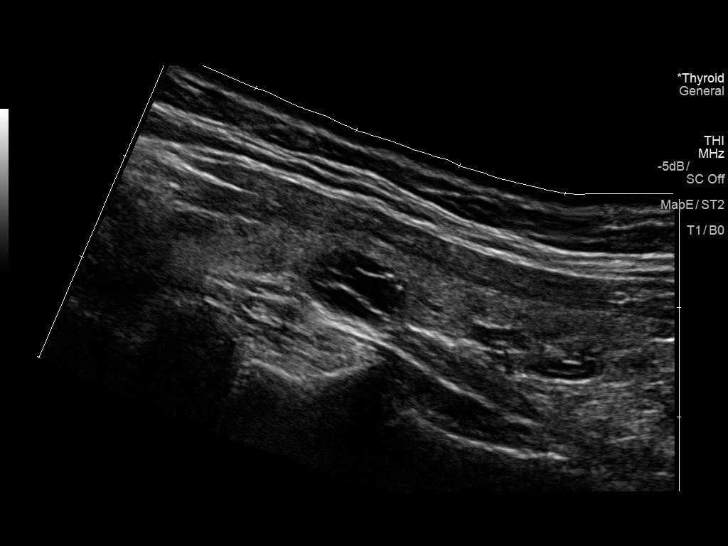
[im 29/77]
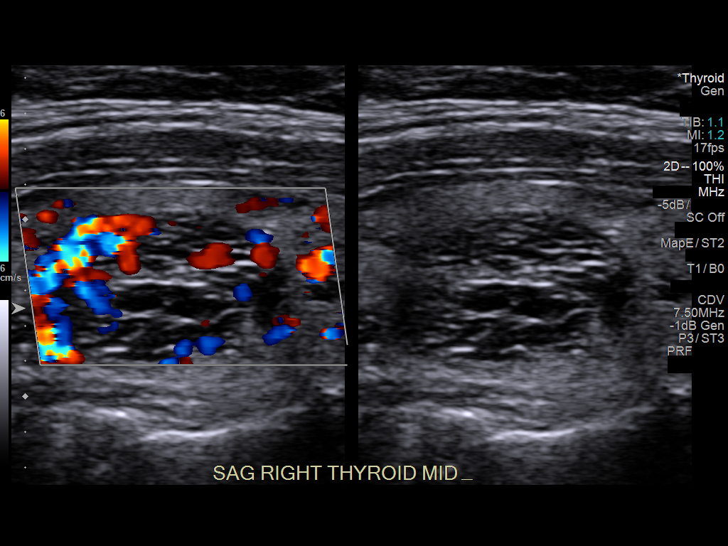
[im 35/77]
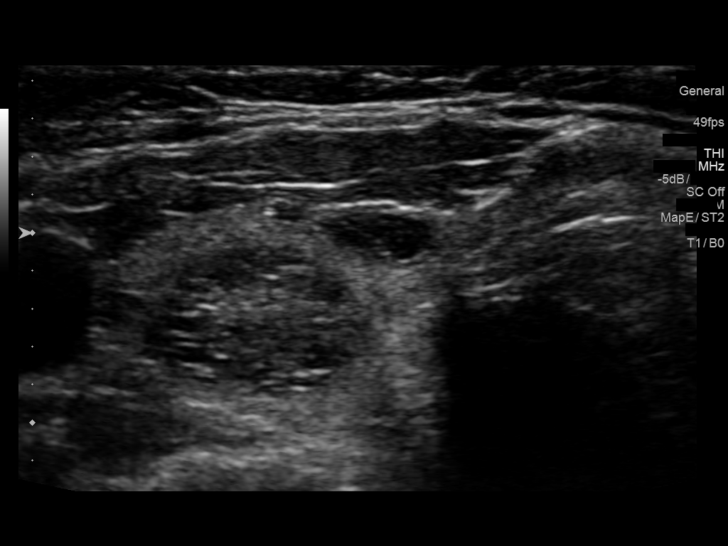
[im 42/77]
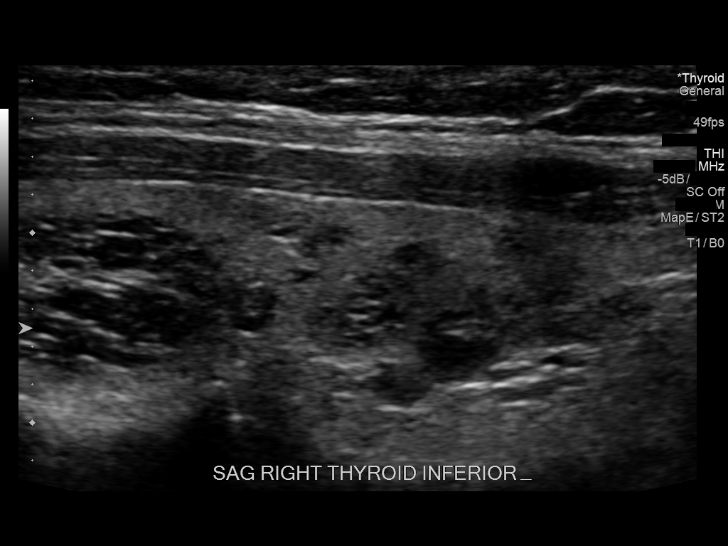
[im 48/77]
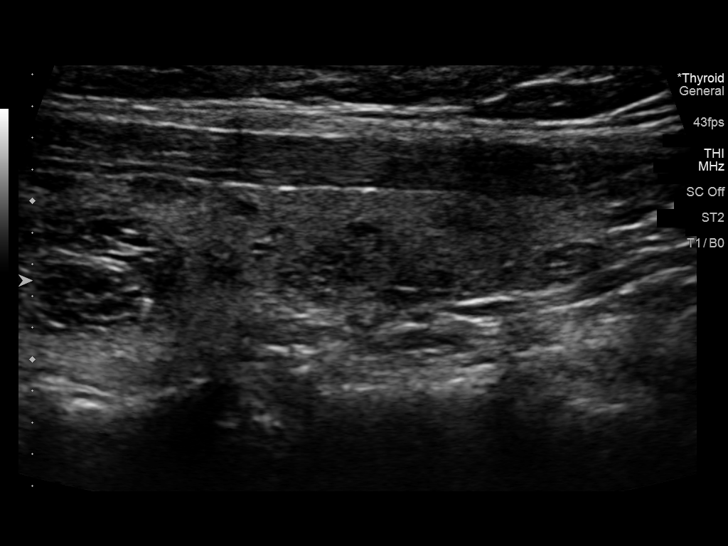
[im 54/77]
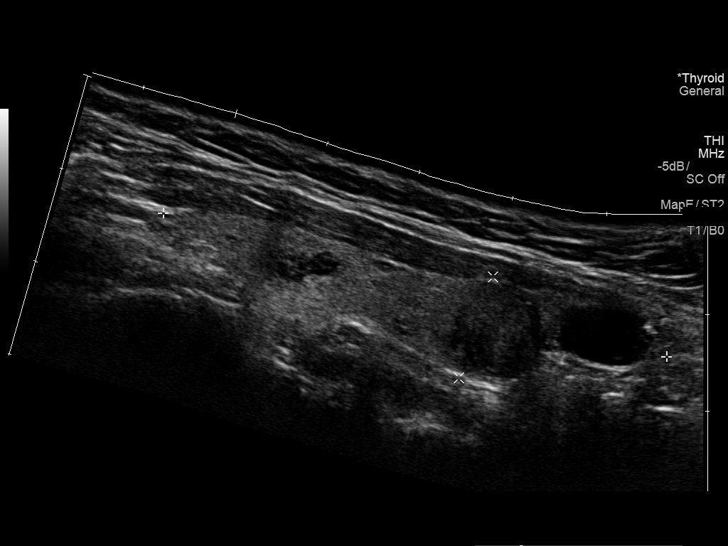
[im 61/77]
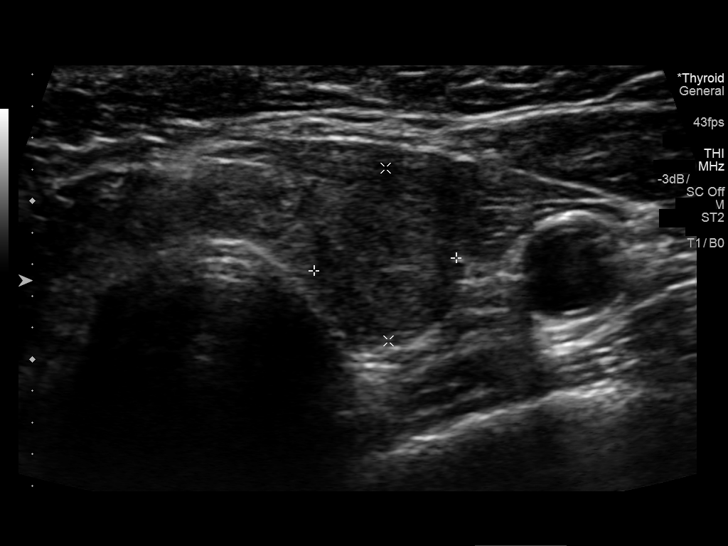
[im 67/77]
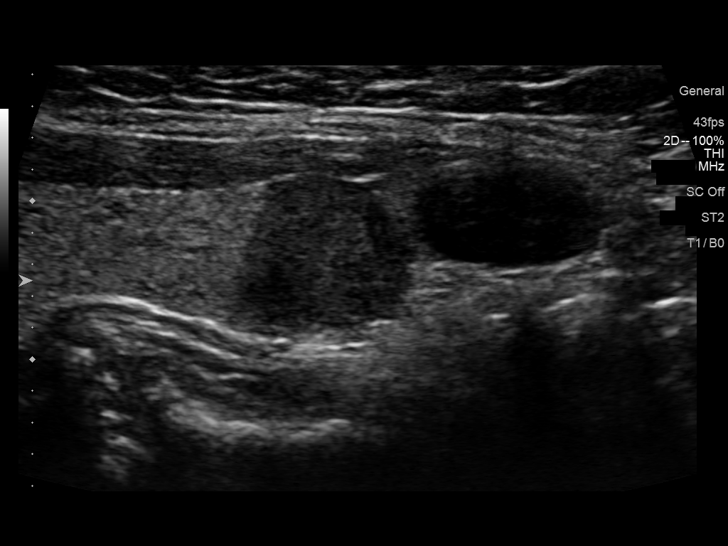
[im 73/77]
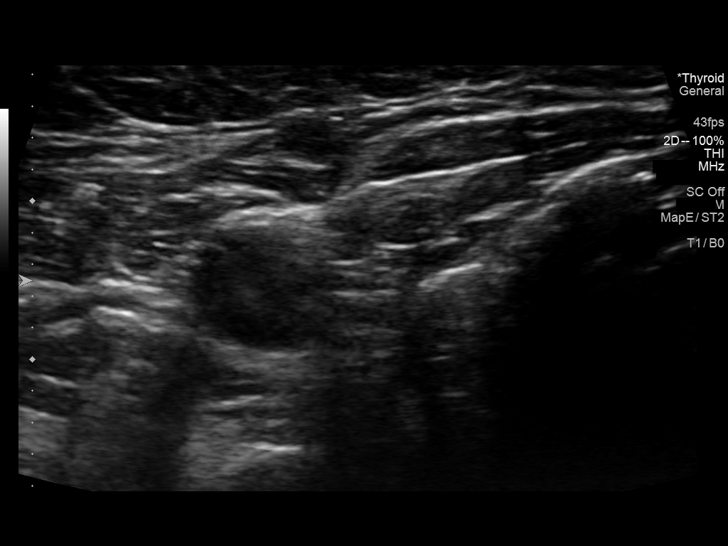

[12 of 25 positions shown; findings below may reference images not displayed]

FINDINGS: Parenchymal Echotexture: Moderately heterogenous

Isthmus: 0.3 cm, previously 0.4 cm

Right lobe: 4.5 x 0.7 x 1.9 cm, previously 5.4 x 1.2 x 1.5 cm

Left lobe: 5.4 x 1.1 x 1.4 cm, previously 4.5 x 0.8 x 1.1 cm

_________________________________________________________

Estimated total number of nodules >/= 1 cm: 4

Number of spongiform nodules >/=  2 cm not described below (TR1): 0

Number of mixed cystic and solid nodules >/= 1.5 cm not described
below (TR2): 0

_________________________________________________________

Nodule # 1:

Prior biopsy: No

Location: Isthmus; Mid

Maximum size: 1.1 cm; Other 2 dimensions: 0.4 x 1.0 cm, previously,
1.0 x 0.3 x 1.1 cm

Composition: solid/almost completely solid (2)

Echogenicity: isoechoic (1)

Shape: not taller-than-wide (0)

Margins: ill-defined (0)

Echogenic foci: none (0)

ACR TI-RADS total points: 3.

ACR TI-RADS risk category:  TR3 (3 points).

Significant change in size (>/= 20% in two dimensions and minimal
increase of 2 mm): No

Change in features: No

Change in ACR TI-RADS risk category: No

ACR TI-RADS recommendations:

Given size (<1.4 cm) and appearance, this nodule does NOT meet
TI-RADS criteria for biopsy or dedicated follow-up.

_________________________________________________________

Nodule # 2:

Prior biopsy: No

Location: Isthmus; Inferior

Maximum size: 0.7 cm; Other 2 dimensions: 0.4 x 0.5 cm, previously,
1.0 x 0.5 x 0.8 cm

Composition: solid/almost completely solid (2)

Echogenicity: hypoechoic (2)

Shape: not taller-than-wide (0)

Margins: ill-defined (0)

Echogenic foci: none (0)

ACR TI-RADS total points: 4.

ACR TI-RADS risk category:  TR4 (4-6 points).

Significant change in size (>/= 20% in two dimensions and minimal
increase of 2 mm): No

Change in features: No

Change in ACR TI-RADS risk category: No

ACR TI-RADS recommendations:

Given size (<0.9 cm) and appearance, this nodule does NOT meet
TI-RADS criteria for biopsy or dedicated follow-up.

_________________________________________________________

Cyst along the left inferior aspect of the isthmus measuring 1.1 x
0.6 x 1.1 cm and previously measured 0.9 x 0.6 x 0.9 cm.

Nodule # 3:

Prior biopsy: No

Location: Right; Mid

Maximum size: 1.4 cm; Other 2 dimensions: 0.9 x 1.3 cm, previously,
1.4 x 0.9 x 1.0 cm

Composition: spongiform (0)

Echogenicity: cannot determine (1)

Shape: not taller-than-wide (0)

Margins: smooth (0)

Echogenic foci: none (0)

ACR TI-RADS total points: 1.

ACR TI-RADS risk category:  TR1 (0-1 points).

Significant change in size (>/= 20% in two dimensions and minimal
increase of 2 mm): No

Change in features: No

Change in ACR TI-RADS risk category: No

ACR TI-RADS recommendations:

This nodule does NOT meet TI-RADS criteria for biopsy or dedicated
follow-up.

_________________________________________________________

Nodule # 4:

Prior biopsy: No

Location: Left; Inferior

Maximum size: 1.1 cm; Other 2 dimensions: 1.1 x 0.9 cm, previously,
1.0 x 1.0 x 0.9 cm in 7062. This nodule measured 0.9 x 0.9 x 0.8 cm
on 10/11/2010. This nodule measured 0.8 x 0.7 x 0.8 cm on
10/08/2006.

Composition: solid/almost completely solid (2)

Echogenicity: hypoechoic (2)

Shape: taller-than-wide (3)

Margins: ill-defined (0)

Echogenic foci: none (0)

ACR TI-RADS total points: 7.

ACR TI-RADS risk category:  TR5 (>/= 7 points).

Significant change in size (>/= 20% in two dimensions and minimal
increase of 2 mm): No

Change in features: No

Change in ACR TI-RADS risk category: No

ACR TI-RADS recommendations:

This nodule meets criteria for biopsy based on its size and
morphology. However, there has been minimal change over 11 years and
likely a benign nodule.

_________________________________________________________

Subcentimeter nodules in both thyroid lobes.

No enlarged lymph nodes.
IMPRESSION: Multinodular goiter.

Dominant thyroid nodules have not significantly changed since 7062.

Nodule #4 in the inferior left thyroid lobe meets criteria for
biopsy based on TI-RADS but this nodule has minimally enlarged over
11 years. This is most compatible with a benign nodule.

The above is in keeping with the ACR TI-RADS recommendations - [HOSPITAL] 7062;[DATE].

## 2019-07-12 ENCOUNTER — Other Ambulatory Visit: Payer: Self-pay | Admitting: Internal Medicine

## 2019-07-12 DIAGNOSIS — E042 Nontoxic multinodular goiter: Secondary | ICD-10-CM

## 2019-11-16 ENCOUNTER — Ambulatory Visit
Admission: RE | Admit: 2019-11-16 | Discharge: 2019-11-16 | Disposition: A | Discharge status: Home / Self Care | Payer: PRIVATE HEALTH INSURANCE | Source: Ambulatory Visit | Attending: Internal Medicine | Admitting: Internal Medicine

## 2019-11-16 DIAGNOSIS — E042 Nontoxic multinodular goiter: Secondary | ICD-10-CM

## 2020-01-26 ENCOUNTER — Other Ambulatory Visit: Payer: Self-pay | Admitting: Family Medicine

## 2020-01-26 ENCOUNTER — Other Ambulatory Visit (HOSPITAL_COMMUNITY)
Admission: RE | Admit: 2020-01-26 | Discharge: 2020-01-26 | Disposition: A | Discharge status: Home / Self Care | Payer: Managed Care, Other (non HMO) | Source: Ambulatory Visit | Attending: Family Medicine | Admitting: Family Medicine

## 2020-01-26 DIAGNOSIS — Z124 Encounter for screening for malignant neoplasm of cervix: Secondary | ICD-10-CM | POA: Diagnosis present

## 2020-01-31 LAB — CYTOLOGY - PAP
Chlamydia: NEGATIVE
Comment: NEGATIVE
Comment: NEGATIVE
Comment: NORMAL
Diagnosis: NEGATIVE
High risk HPV: NEGATIVE
Neisseria Gonorrhea: NEGATIVE

## 2022-02-02 IMAGING — US US THYROID
1 series · 13 of 25 positions shown · non-contrast
Comparison: Prior thyroid ultrasound 01/11/2018, 08/05/2016 and
01/01/2015

CLINICAL DATA: Goiter. 58-year-old female with a history of
multiple thyroid nodules dating back to 7044.

EXAM:
THYROID ULTRASOUND
TECHNIQUE: Ultrasound examination of the thyroid gland and adjacent soft
tissues was performed.

[Series 1: us thyroid · 0.07mm/px · 13 of 92 slices shown]
[im 1/92]
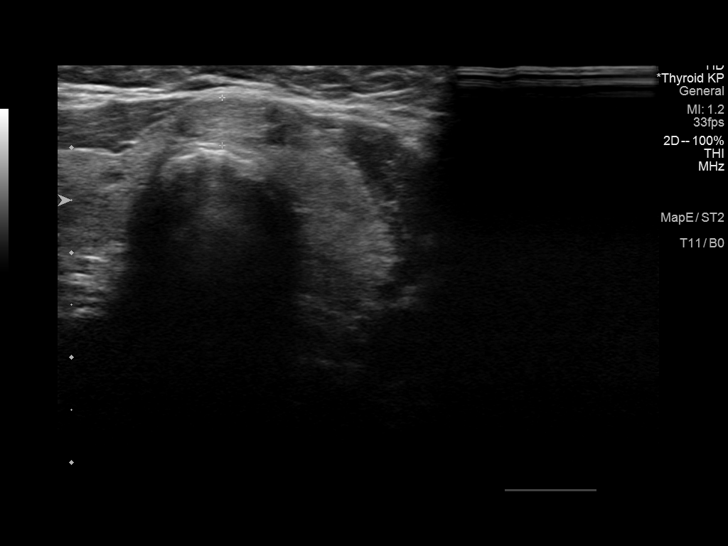
[im 8/92]
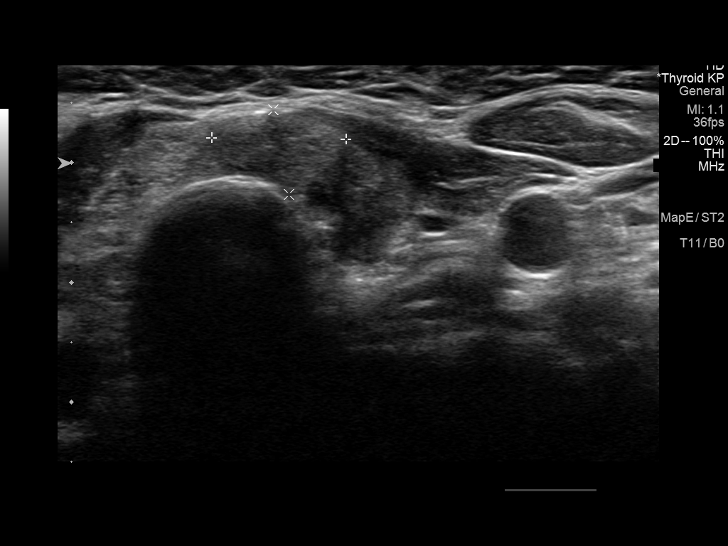
[im 16/92]
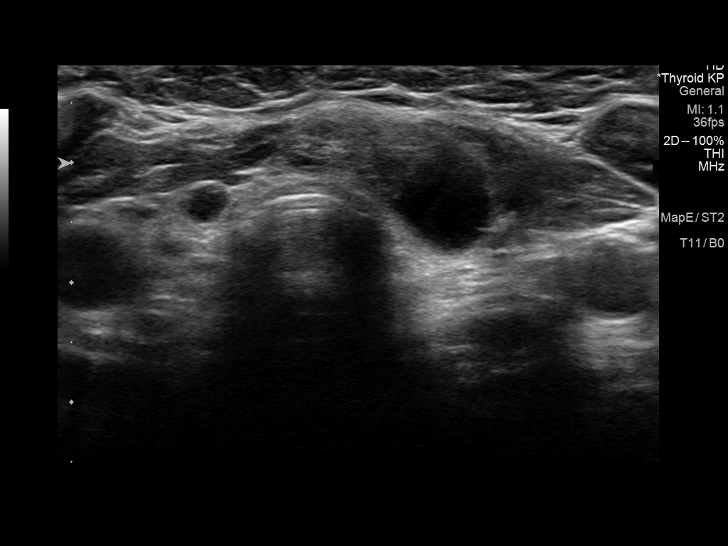
[im 23/92]
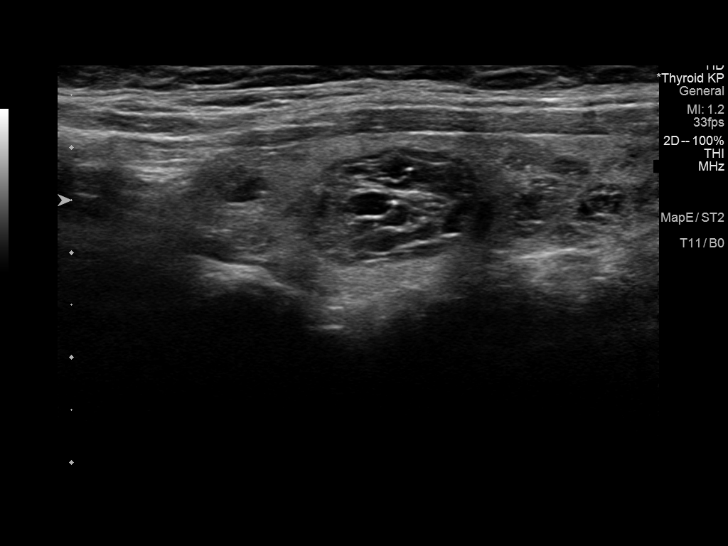
[im 31/92]
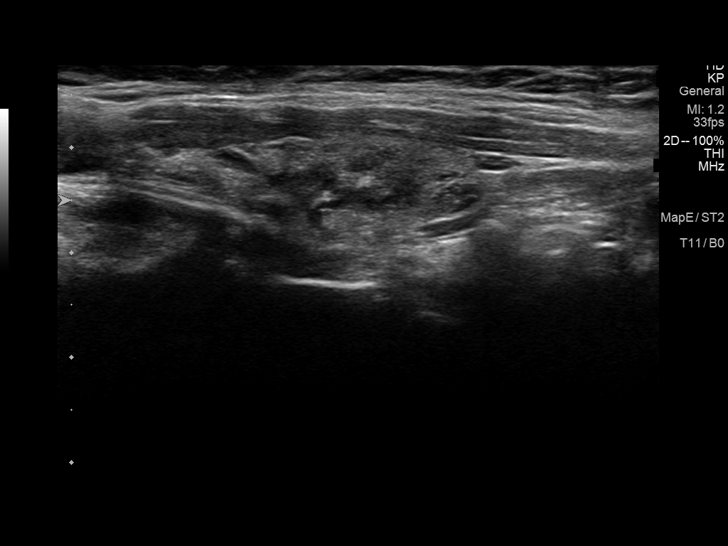
[im 38/92]
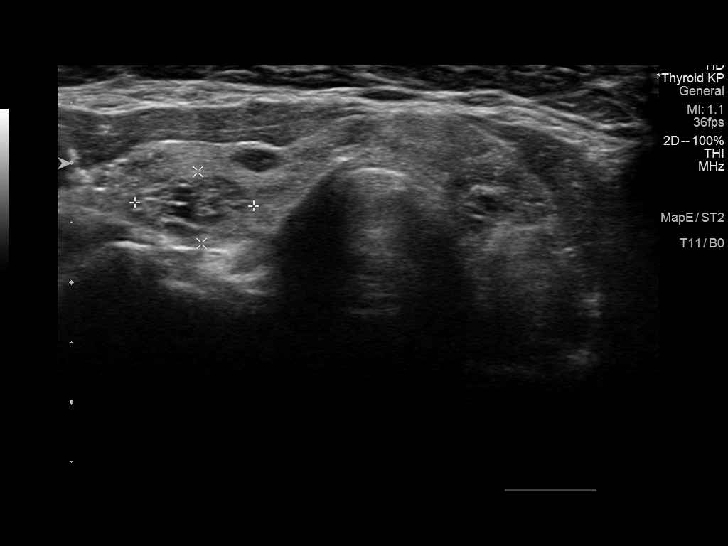
[im 46/92]
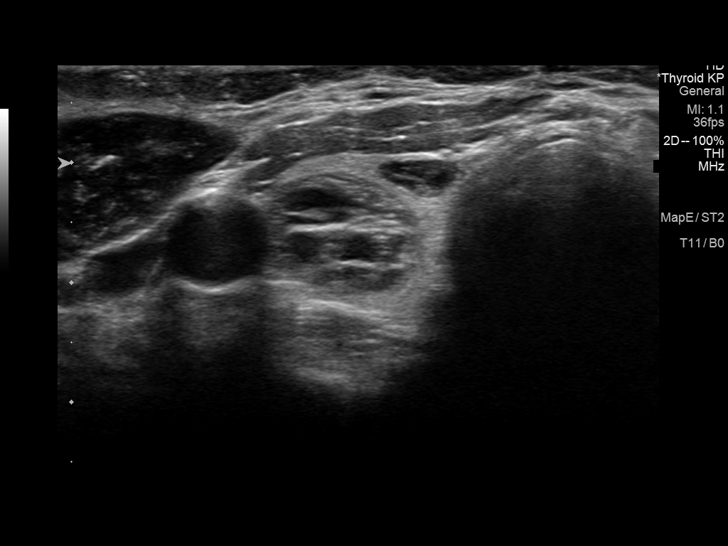
[im 54/92]
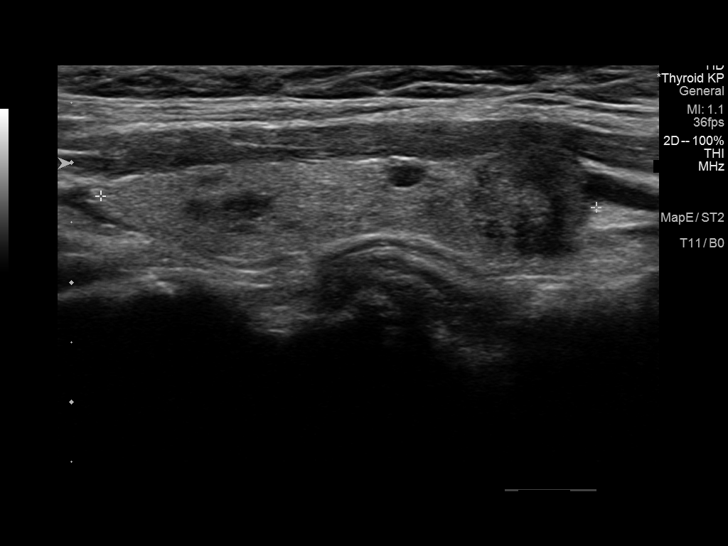
[im 61/92]
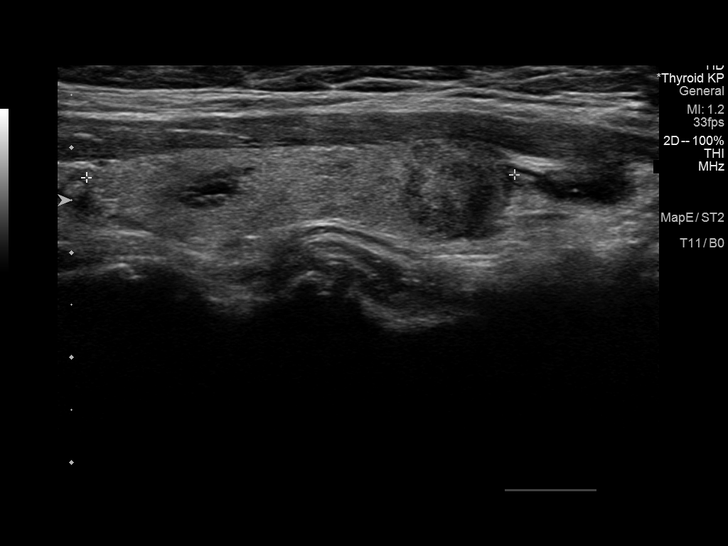
[im 69/92]
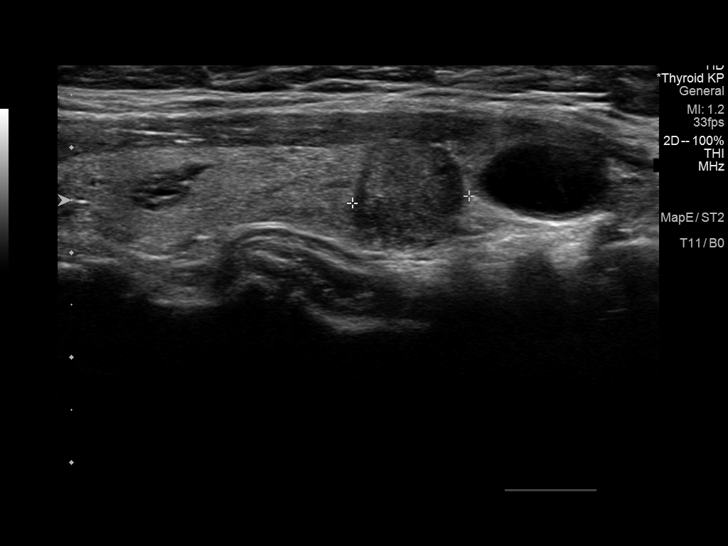
[im 76/92]
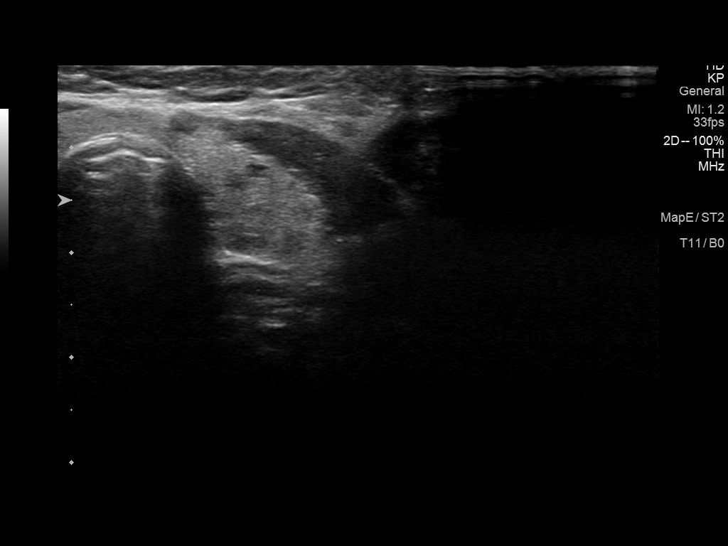
[im 84/92]
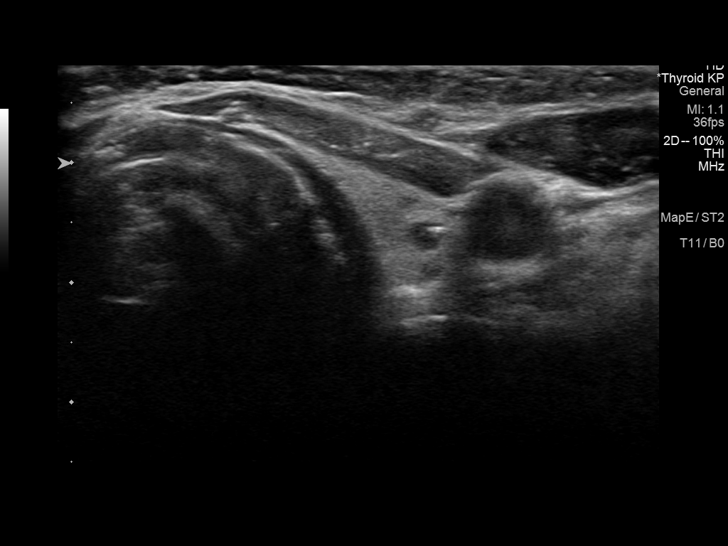
[im 92/92]
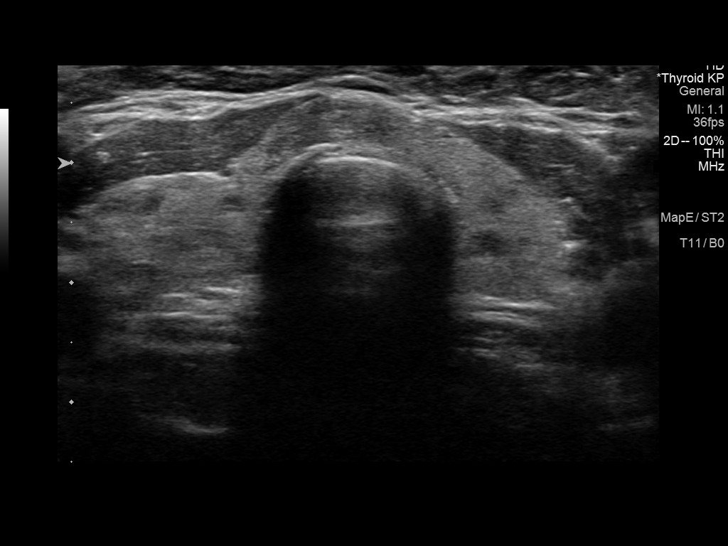

[13 of 25 positions shown; findings below may reference images not displayed]

FINDINGS: Parenchymal Echotexture: Markedly heterogenous

Isthmus: 0.5 cm

Right lobe: 5.7 x 1.3 x 1.5 cm

Left lobe: 4.1 x 1.2 x 1.3 cm

_________________________________________________________

Estimated total number of nodules >/= 1 cm: 5

Number of spongiform nodules >/=  2 cm not described below (TR1): 0

Number of mixed cystic and solid nodules >/= 1.5 cm not described
below (TR2): 0

_________________________________________________________

Multiple thyroid nodules in the right mid gland and thyroid isthmus
are again noted and demonstrate no significant interval change in
size, appearance or imaging characteristics compared to multiple
prior studies. Greater than 5 year stability of these lesions is
consistent with benignity. No further follow-up required.

Nodule # 4: Continued stability of the 1.1 cm hypoechoic solid
nodule in the inferior aspect of the left thyroid gland for more
than 5 years. Of note, the nodule appears less taller than wide and
morphology on today's examination which would downgraded to a
TI-RADS category 4 nodule. As previously noted, this is almost
certainly benign.
IMPRESSION: Continued greater than 5 year stability of a multiple bilateral and
isthmic thyroid nodules consistent with benign multinodular goiter.

No new nodules or suspicious features on today's exam.

The above is in keeping with the ACR TI-RADS recommendations - [HOSPITAL] 8331;[DATE].

## 2022-09-01 ENCOUNTER — Other Ambulatory Visit: Payer: Self-pay | Admitting: Home Modifications

## 2022-09-01 DIAGNOSIS — Z8639 Personal history of other endocrine, nutritional and metabolic disease: Secondary | ICD-10-CM

## 2022-09-12 ENCOUNTER — Ambulatory Visit
Admission: RE | Admit: 2022-09-12 | Discharge: 2022-09-12 | Disposition: A | Discharge status: Home / Self Care | Payer: PRIVATE HEALTH INSURANCE | Source: Ambulatory Visit | Attending: Home Modifications | Admitting: Home Modifications

## 2022-09-12 DIAGNOSIS — Z8639 Personal history of other endocrine, nutritional and metabolic disease: Secondary | ICD-10-CM

## 2022-11-19 ENCOUNTER — Other Ambulatory Visit (HOSPITAL_BASED_OUTPATIENT_CLINIC_OR_DEPARTMENT_OTHER): Payer: Self-pay | Admitting: Family Medicine

## 2022-11-19 DIAGNOSIS — R0981 Nasal congestion: Secondary | ICD-10-CM

## 2024-01-10 ENCOUNTER — Emergency Department (HOSPITAL_COMMUNITY)
Admission: EM | Admit: 2024-01-10 | Discharge: 2024-01-10 | Disposition: A | Discharge status: Home / Self Care | Attending: Emergency Medicine | Admitting: Emergency Medicine

## 2024-01-10 ENCOUNTER — Encounter (HOSPITAL_COMMUNITY): Payer: Self-pay | Admitting: Emergency Medicine

## 2024-01-10 ENCOUNTER — Other Ambulatory Visit: Payer: Self-pay

## 2024-01-10 ENCOUNTER — Emergency Department (HOSPITAL_COMMUNITY)

## 2024-01-10 DIAGNOSIS — R110 Nausea: Secondary | ICD-10-CM | POA: Insufficient documentation

## 2024-01-10 DIAGNOSIS — R42 Dizziness and giddiness: Secondary | ICD-10-CM | POA: Diagnosis present

## 2024-01-10 LAB — BASIC METABOLIC PANEL WITH GFR
Anion gap: 8 (ref 5–15)
BUN: 17 mg/dL (ref 8–23)
CO2: 24 mmol/L (ref 22–32)
Calcium: 8.8 mg/dL — ABNORMAL LOW (ref 8.9–10.3)
Chloride: 103 mmol/L (ref 98–111)
Creatinine, Ser: 0.51 mg/dL (ref 0.44–1.00)
GFR, Estimated: >60 mL/min (ref >60)
Glucose, Bld: 139 mg/dL — ABNORMAL HIGH (ref 70–99)
Potassium: 4.3 mmol/L (ref 3.5–5.1)
Sodium: 135 mmol/L (ref 135–145)

## 2024-01-10 LAB — CBC WITH DIFFERENTIAL/PLATELET
Abs Immature Granulocytes: 0.03 10*3/uL (ref 0.00–0.07)
Basophils Absolute: 0 10*3/uL (ref 0.0–0.1)
Basophils Relative: 0 %
Eosinophils Absolute: 0 10*3/uL (ref 0.0–0.5)
Eosinophils Relative: 0 %
HCT: 38.2 % (ref 36.0–46.0)
Hemoglobin: 12.6 g/dL (ref 12.0–15.0)
Immature Granulocytes: 0 %
Lymphocytes Relative: 9 %
Lymphs Abs: 1.3 10*3/uL (ref 0.7–4.0)
MCH: 31.7 pg (ref 26.0–34.0)
MCHC: 33 g/dL (ref 30.0–36.0)
MCV: 96 fL (ref 80.0–100.0)
Monocytes Absolute: 0.9 10*3/uL (ref 0.1–1.0)
Monocytes Relative: 6 %
Neutro Abs: 12.1 10*3/uL — ABNORMAL HIGH (ref 1.7–7.7)
Neutrophils Relative %: 85 %
Platelets: 316 10*3/uL (ref 150–400)
RBC: 3.98 MIL/uL (ref 3.87–5.11)
RDW: 12.8 % (ref 11.5–15.5)
WBC: 14.4 10*3/uL — ABNORMAL HIGH (ref 4.0–10.5)
nRBC: 0 % (ref 0.0–0.2)

## 2024-01-10 LAB — TROPONIN I (HIGH SENSITIVITY)
Troponin I (High Sensitivity): <2 ng/L (ref <18)
Troponin I (High Sensitivity): <2 ng/L (ref <18)

## 2024-01-10 LAB — TSH: TSH: 0.767 u[IU]/mL (ref 0.350–4.500)

## 2024-01-10 LAB — CBG MONITORING, ED: Glucose-Capillary: 133 mg/dL — ABNORMAL HIGH (ref 70–99)

## 2024-01-10 MED ORDER — LACTATED RINGERS IV BOLUS
1000.0000 mL | Freq: Once | INTRAVENOUS | Status: AC
  Administered 2024-01-10: 1000 mL via INTRAVENOUS

## 2024-01-10 MED ORDER — ONDANSETRON HCL 4 MG PO TABS: 4.0000 mg | ORAL_TABLET | Freq: Four times a day (QID) | ORAL | 0 refills | Status: AC

## 2024-01-10 MED ORDER — ONDANSETRON HCL 4 MG/2ML IJ SOLN
4.0000 mg | Freq: Once | INTRAMUSCULAR | Status: AC
  Administered 2024-01-10: 4 mg via INTRAVENOUS
  Filled 2024-01-10: qty 2

## 2024-01-10 NOTE — ED Triage Notes (Signed)
 Pt bib EMS from home. Pt c/o dizziness, nausea, vomiting, headache. Denies diarrhea. Pt states pain started last night before bed and took tylenol and has not resolved since.

## 2024-01-10 NOTE — ED Provider Notes (Signed)
 [place] EMERGENCY DEPARTMENT AT [facility] Provider Note   CSN: [id number] Arrival date & time: 01/10/24  1725     History {Add pertinent medical, surgical, social history, OB history to HPI:1} Chief Complaint  Patient presents with   Near Syncope   Emesis   Nausea    Marie Avery is a 63 y.o. female.  63 year old female with a history of thyroid nodule who presents emergency department with lightheadedness.  Patient reports that since this morning she felt lightheaded.  Also felt more thirsty than usual.  Says that when she tried getting up she felt as though she was going to pass out so decided come into the emergency department for evaluation.  No chest pain, shortness of breath, or palpitations.  Over the past 2 nights has felt some heartburn so has been taking Pepcid for that.  Did have an episode of nonbloody nonbilious emesis prior to arrival.  No other recent illnesses.       Home Medications Prior to Admission medications   Medication Sig Start Date End Date Taking? Authorizing Provider  Cholecalciferol (VITAMIN D3) 1000 UNITS CAPS Take 1 capsule by mouth daily.    [provider]  ibuprofen (ADVIL,MOTRIN) 600 MG tablet Take 600 mg by mouth every 6 (six) hours as needed.    [provider]  levothyroxine (SYNTHROID, LEVOTHROID) 100 MCG tablet Take 100 mcg by mouth daily.    [provider]  Multiple Vitamins-Minerals (MULTIVITAL PO) Take 1 tablet by mouth daily.    [provider]      Allergies    Tetracyclines & related, Ceclor [cefaclor], Penicillins, and Sulfa drugs cross reactors    Review of Systems   Review of Systems  Physical Exam Updated Vital Signs BP (!) 151/83 (BP Location: Left Arm)   Pulse 72   Temp (!) 97.5 F (36.4 C) (Oral)   Resp 16   Ht 5\' 5"  (1.651 m)   Wt 68.9 kg   SpO2 99%   BMI 25.29 kg/m  Physical Exam Vitals and nursing note reviewed.  Constitutional:      General: She is  not in acute distress.    Appearance: She is well-developed.  HENT:     Head: Normocephalic and atraumatic.     Right Ear: External ear normal.     Left Ear: External ear normal.     Nose: Nose normal.  Eyes:     Extraocular Movements: Extraocular movements intact.     Conjunctiva/sclera: Conjunctivae normal.     Pupils: Pupils are equal, round, and reactive to light.  Cardiovascular:     Rate and Rhythm: Normal rate and regular rhythm.     Heart sounds: No murmur heard. Pulmonary:     Effort: Pulmonary effort is normal. No respiratory distress.     Breath sounds: Normal breath sounds.  Abdominal:     General: Abdomen is flat. There is no distension.     Palpations: Abdomen is soft. There is no mass.     Tenderness: There is no abdominal tenderness. There is no guarding.  Musculoskeletal:     Cervical back: Normal range of motion and neck supple.     Right lower leg: No edema.     Left lower leg: No edema.  Skin:    General: Skin is warm and dry.  Neurological:     Mental Status: She is alert and oriented to person, place, and time. Mental status is at baseline.  Cranial Nerves: No cranial nerve deficit.     Sensory: No sensory deficit.     Motor: No weakness.  Psychiatric:        Mood and Affect: Mood normal.     ED Results / Procedures / Treatments   Labs (all labs ordered are listed, but only abnormal results are displayed) Labs Reviewed  BASIC METABOLIC PANEL WITH GFR  CBC WITH DIFFERENTIAL/PLATELET  TSH  T4, FREE  CBG MONITORING, ED  CBG MONITORING, ED  TROPONIN I (HIGH SENSITIVITY)    EKG None  Radiology No results found.  Procedures Procedures  {Document cardiac monitor, telemetry assessment procedure when appropriate:1}  Medications Ordered in ED Medications  lactated ringers bolus 1,000 mL (has no administration in time range)  ondansetron (ZOFRAN) injection 4 mg (has no administration in time range)    ED Course/ Medical Decision Making/  A&P   {   Click here for ABCD2, HEART and other calculatorsREFRESH Note before signing :1}                              Medical Decision Making Amount and/or Complexity of Data Reviewed Labs: ordered. Radiology: ordered.  Risk Prescription drug management.   ***  {Document critical care time when appropriate:1} {Document review of labs and clinical decision tools ie heart score, Chads2Vasc2 etc:1}  {Document your independent review of radiology images, and any outside records:1} {Document your discussion with family members, caretakers, and with consultants:1} {Document social determinants of health affecting pt's care:1} {Document your decision making why or why not admission, treatments were needed:1} Final Clinical Impression(s) / ED Diagnoses Final diagnoses:  None    Rx / DC Orders ED Discharge Orders     None

## 2024-01-10 NOTE — Discharge Instructions (Addendum)
 You were seen for your lightheadedness in the emergency department.   At home, please stay well-hydrated.  Take Zofran for nausea.    Check your MyChart online for the results of any tests that had not resulted by the time you left the emergency department.   Follow-up with your primary doctor in 2-3 days regarding your visit.    Return immediately to the emergency department if you experience any of the following: Abdominal pain, vomiting, fainting, or any other concerning symptoms.    Thank you for visiting our Emergency Department. It was a pleasure taking care of you today.

## 2024-09-06 ENCOUNTER — Other Ambulatory Visit: Payer: Self-pay | Admitting: Nurse Practitioner

## 2024-09-06 DIAGNOSIS — E041 Nontoxic single thyroid nodule: Secondary | ICD-10-CM

## 2024-10-11 ENCOUNTER — Ambulatory Visit
Admission: RE | Admit: 2024-10-11 | Discharge: 2024-10-11 | Disposition: A | Payer: PRIVATE HEALTH INSURANCE | Source: Ambulatory Visit | Attending: Nurse Practitioner | Admitting: Nurse Practitioner

## 2024-10-11 DIAGNOSIS — E041 Nontoxic single thyroid nodule: Secondary | ICD-10-CM

## 2024-10-20 ENCOUNTER — Other Ambulatory Visit (HOSPITAL_BASED_OUTPATIENT_CLINIC_OR_DEPARTMENT_OTHER): Payer: Self-pay | Admitting: Nurse Practitioner

## 2024-10-20 DIAGNOSIS — E785 Hyperlipidemia, unspecified: Secondary | ICD-10-CM

## 2024-11-11 ENCOUNTER — Ambulatory Visit (HOSPITAL_BASED_OUTPATIENT_CLINIC_OR_DEPARTMENT_OTHER): Admission: RE | Admit: 2024-11-11 | Payer: Self-pay | Source: Ambulatory Visit

## 2024-11-11 DIAGNOSIS — E785 Hyperlipidemia, unspecified: Secondary | ICD-10-CM

## 2024-12-13 ENCOUNTER — Ambulatory Visit (HOSPITAL_COMMUNITY): Payer: PRIVATE HEALTH INSURANCE

## 2024-12-13 ENCOUNTER — Encounter: Payer: PRIVATE HEALTH INSURANCE | Admitting: Vascular Surgery
